# Patient Record
Sex: Female | Born: 1970 | Race: White | Hispanic: No | Marital: Married | Smoking: Never smoker
Health system: Southern US, Community
[De-identification: ages and names within clinical notes are randomized; demographics above are authoritative.]

## PROBLEM LIST (undated history)

## (undated) DIAGNOSIS — R51 Headache: Secondary | ICD-10-CM

## (undated) DIAGNOSIS — R519 Headache, unspecified: Secondary | ICD-10-CM

## (undated) DIAGNOSIS — R609 Edema, unspecified: Secondary | ICD-10-CM

## (undated) DIAGNOSIS — R112 Nausea with vomiting, unspecified: Secondary | ICD-10-CM

## (undated) DIAGNOSIS — Z9889 Other specified postprocedural states: Secondary | ICD-10-CM

## (undated) DIAGNOSIS — K589 Irritable bowel syndrome without diarrhea: Secondary | ICD-10-CM

## (undated) DIAGNOSIS — G8929 Other chronic pain: Secondary | ICD-10-CM

## (undated) DIAGNOSIS — K219 Gastro-esophageal reflux disease without esophagitis: Secondary | ICD-10-CM

## (undated) HISTORY — DX: Headache, unspecified: R51.9

## (undated) HISTORY — DX: Edema, unspecified: R60.9

## (undated) HISTORY — PX: NO PAST SURGERIES: SHX2092

## (undated) HISTORY — DX: Other chronic pain: G89.29

## (undated) HISTORY — DX: Headache: R51

## (undated) HISTORY — DX: Irritable bowel syndrome, unspecified: K58.9

## (undated) HISTORY — DX: Gastro-esophageal reflux disease without esophagitis: K21.9

---

## 2017-01-06 ENCOUNTER — Encounter: Payer: Self-pay | Admitting: Family Medicine

## 2017-01-06 ENCOUNTER — Other Ambulatory Visit: Payer: Self-pay | Admitting: Family Medicine

## 2017-01-06 ENCOUNTER — Ambulatory Visit (INDEPENDENT_AMBULATORY_CARE_PROVIDER_SITE_OTHER): Payer: 59 | Admitting: Family Medicine

## 2017-01-06 VITALS — BP 120/86 | HR 81 | Temp 98.4°F | Ht 66.0 in | Wt 245.2 lb

## 2017-01-06 DIAGNOSIS — K219 Gastro-esophageal reflux disease without esophagitis: Secondary | ICD-10-CM | POA: Diagnosis not present

## 2017-01-06 DIAGNOSIS — E6609 Other obesity due to excess calories: Secondary | ICD-10-CM

## 2017-01-06 DIAGNOSIS — Z5181 Encounter for therapeutic drug level monitoring: Secondary | ICD-10-CM

## 2017-01-06 DIAGNOSIS — E559 Vitamin D deficiency, unspecified: Secondary | ICD-10-CM | POA: Diagnosis not present

## 2017-01-06 DIAGNOSIS — K589 Irritable bowel syndrome without diarrhea: Secondary | ICD-10-CM | POA: Insufficient documentation

## 2017-01-06 DIAGNOSIS — R600 Localized edema: Secondary | ICD-10-CM

## 2017-01-06 DIAGNOSIS — Z6839 Body mass index (BMI) 39.0-39.9, adult: Secondary | ICD-10-CM

## 2017-01-06 DIAGNOSIS — E669 Obesity, unspecified: Secondary | ICD-10-CM | POA: Insufficient documentation

## 2017-01-06 LAB — COMPREHENSIVE METABOLIC PANEL
ALT: 25 U/L (ref 0–35)
AST: 20 U/L (ref 0–37)
Albumin: 3.9 g/dL (ref 3.5–5.2)
Alkaline Phosphatase: 64 U/L (ref 39–117)
BUN: 17 mg/dL (ref 6–23)
CO2: 29 mEq/L (ref 19–32)
Calcium: 9.4 mg/dL (ref 8.4–10.5)
Chloride: 106 mEq/L (ref 96–112)
Creatinine, Ser: 0.9 mg/dL (ref 0.40–1.20)
GFR: 72.37 mL/min (ref >60.00)
Glucose, Bld: 96 mg/dL (ref 70–99)
Potassium: 4.2 mEq/L (ref 3.5–5.1)
Sodium: 140 mEq/L (ref 135–145)
Total Bilirubin: 0.9 mg/dL (ref 0.2–1.2)
Total Protein: 7.2 g/dL (ref 6.0–8.3)

## 2017-01-06 LAB — VITAMIN D 25 HYDROXY (VIT D DEFICIENCY, FRACTURES): VITD: 22.15 ng/mL — ABNORMAL LOW (ref 30.00–100.00)

## 2017-01-06 MED ORDER — TRIAMTERENE-HCTZ 37.5-25 MG PO TABS: 1.0000 | ORAL_TABLET | Freq: Every day | ORAL | 1 refills | Status: DC

## 2017-01-06 MED ORDER — RANITIDINE HCL 150 MG PO TABS: 150.0000 mg | ORAL_TABLET | Freq: Two times a day (BID) | ORAL | 1 refills | Status: DC

## 2017-01-06 NOTE — Assessment & Plan Note (Signed)
Recheck vitamin D today 

## 2017-01-06 NOTE — Assessment & Plan Note (Signed)
Started on Zantac. Refer to GI.

## 2017-01-06 NOTE — Assessment & Plan Note (Signed)
Constipation predominant. Continue MiraLAX. Given FODMAP diet info.

## 2017-01-06 NOTE — Patient Instructions (Signed)
Nice to meet you. We will start you on Zantac for reflux. We will also refer you to GI. I refilled your Maxzide. We'll check some lab work today as well. Please look at the diet information for IBS. Please work on diet and exercise as well.

## 2017-01-06 NOTE — Progress Notes (Signed)
Pre visit review using our clinic review tool, if applicable. No additional management support is needed unless otherwise documented below in the visit note. 

## 2017-01-06 NOTE — Progress Notes (Signed)
Casey Rumps, MD Phone: [phone]  Casey Colon is a 46 y.o. female who presents today for new patient visit.  GERD: Patient notes occasional burning sensation. Has been on Prilosec. This was beneficial. She's come off of this and occasionally gets burning sensation. No abdominal pain or blood in her stool. Burps a lot.  She is on Maxzide for edema in her ankles and calves. Has been on this chronically. Notes this does help with this. No prior kidney dysfunction. No blood pressure issues.  Vitamin D deficiency: Was on vitamin D supplementation and discontinued this several months ago. Never had a recheck.  Has constipation predominant IBS and does take MiraLAX with good benefit for this.  Patient notes she was on phentermine for weight loss though has not taken in several months. She doesn't really do much to help with her diet. Likes her sweets. Not exercising at all.  Active Ambulatory Problems    Diagnosis Date Noted  . GERD (gastroesophageal reflux disease) 01/06/2017  . Vitamin D deficiency 01/06/2017  . Obesity 01/06/2017  . IBS (irritable bowel syndrome) 01/06/2017  . Bilateral lower extremity edema 01/06/2017   Resolved Ambulatory Problems    Diagnosis Date Noted  . No Resolved Ambulatory Problems   Past Medical History:  Diagnosis Date  . Chronic headaches   . Edema   . GERD (gastroesophageal reflux disease)   . IBS (irritable bowel syndrome)     Family History  Problem Relation Age of Onset  . Breast cancer Maternal Grandmother     Social History   Social History  . Marital status: Married    Spouse name: N/A  . Number of children: N/A  . Years of education: N/A   Occupational History  . Not on file.   Social History Main Topics  . Smoking status: Never Smoker  . Smokeless tobacco: Never Used  . Alcohol use No  . Drug use: No  . Sexual activity: Not on file   Other Topics Concern  . Not on file   Social History Narrative  . No narrative  on file    ROS  General:  Negative for nexplained weight loss, fever Skin: Negative for new or changing mole, sore that won't heal HEENT: Negative for trouble hearing, trouble seeing, ringing in ears, mouth sores, hoarseness, change in voice, dysphagia. CV:  Negative for chest pain, dyspnea, edema, palpitations Resp: Negative for cough, dyspnea, hemoptysis GI: Negative for nausea, vomiting, diarrhea, constipation, abdominal pain, melena, hematochezia. GU: Negative for dysuria, incontinence, urinary hesitance, hematuria, vaginal or penile discharge, polyuria, sexual difficulty, lumps in testicle or breasts MSK: Negative for muscle cramps or aches, joint pain or swelling Neuro: Negative for headaches, weakness, numbness, dizziness, passing out/fainting Psych: Negative for depression, anxiety, memory problems  Objective  Physical Exam Vitals:   01/06/17 1330  BP: 120/86  Pulse: 81  Temp: 98.4 F (36.9 C)    BP Readings from Last 3 Encounters:  01/06/17 120/86   Wt Readings from Last 3 Encounters:  01/06/17 245 lb 3.2 oz (111.2 kg)    Physical Exam  Constitutional: No distress.  HENT:  Head: Normocephalic and atraumatic.  Eyes: Conjunctivae are normal. Pupils are equal, round, and reactive to light.  Cardiovascular: Normal rate, regular rhythm and normal heart sounds.   Pulmonary/Chest: Effort normal and breath sounds normal.  Abdominal: Soft. Bowel sounds are normal. She exhibits no distension. There is no tenderness. There is no rebound and no guarding.  Musculoskeletal: She exhibits no edema.  Neurological:  She is alert. Gait normal.  Skin: Skin is warm and dry. She is not diaphoretic.  Psychiatric: Mood and affect normal.     Assessment/Plan:   GERD (gastroesophageal reflux disease) Started on Zantac. Refer to GI.  Vitamin D deficiency Recheck vitamin D today.  Obesity Discussed that I do not prescribe phentermine. Could consider other weight loss medication  in the future though I would require her to try diet and exercise for 1-2 months in the very least. Discussed exercising and diet changes.  IBS (irritable bowel syndrome) Constipation predominant. Continue MiraLAX. Given FODMAP diet info.  Bilateral lower extremity edema Chronic intermittent issue. Is on Maxzide for this. No edema at this time. We'll request prior records. CMP today. Refill Maxzide.   Orders Placed This Encounter  Procedures  . Comp Met (CMET)  . Vitamin D (25 hydroxy)  . Ambulatory referral to Gastroenterology    Referral Priority:   Routine    Referral Type:   Consultation    Referral Reason:   Specialty Services Required    Number of Visits Requested:   1    Meds ordered this encounter  Medications  . DISCONTD: triamterene-hydrochlorothiazide (MAXZIDE-25) 37.5-25 MG tablet    Sig: Take 1 tablet by mouth daily.  Marland Kitchen DISCONTD: Vitamin D, Ergocalciferol, (DRISDOL) 50000 units CAPS capsule    Sig: Take 50,000 Units by mouth every 7 (seven) days.  . calcium carbonate (TUMS - DOSED IN MG ELEMENTAL CALCIUM) 500 MG chewable tablet    Sig: Chew 1 tablet by mouth daily.  Marland Kitchen DISCONTD: phentermine 37.5 MG capsule    Sig: Take 37.5 mg by mouth every morning.  . triamterene-hydrochlorothiazide (MAXZIDE-25) 37.5-25 MG tablet    Sig: Take 1 tablet by mouth daily.    Dispense:  90 tablet    Refill:  1  . ranitidine (ZANTAC) 150 MG tablet    Sig: Take 1 tablet (150 mg total) by mouth 2 (two) times daily.    Dispense:  60 tablet    Refill:  [place], MD [place]

## 2017-01-06 NOTE — Assessment & Plan Note (Signed)
Discussed that I do not prescribe phentermine. Could consider other weight loss medication in the future though I would require her to try diet and exercise for 1-2 months in the very least. Discussed exercising and diet changes.

## 2017-01-06 NOTE — Assessment & Plan Note (Signed)
Chronic intermittent issue. Is on Maxzide for this. No edema at this time. We'll request prior records. CMP today. Refill Maxzide.

## 2017-01-11 ENCOUNTER — Other Ambulatory Visit: Payer: Self-pay | Admitting: Family Medicine

## 2017-01-11 DIAGNOSIS — E559 Vitamin D deficiency, unspecified: Secondary | ICD-10-CM

## 2017-01-11 MED ORDER — VITAMIN D (ERGOCALCIFEROL) 1.25 MG (50000 UNIT) PO CAPS: 50000.0000 [IU] | ORAL_CAPSULE | ORAL | 0 refills | Status: DC

## 2017-01-13 ENCOUNTER — Encounter: Payer: Self-pay | Admitting: Gastroenterology

## 2017-02-10 ENCOUNTER — Ambulatory Visit: Payer: 59 | Admitting: Gastroenterology

## 2017-02-10 ENCOUNTER — Ambulatory Visit: Payer: Self-pay | Admitting: Gastroenterology

## 2017-02-26 ENCOUNTER — Ambulatory Visit (INDEPENDENT_AMBULATORY_CARE_PROVIDER_SITE_OTHER): Payer: 59 | Admitting: Family Medicine

## 2017-02-26 ENCOUNTER — Encounter: Payer: Self-pay | Admitting: Family Medicine

## 2017-02-26 ENCOUNTER — Ambulatory Visit: Payer: 59 | Admitting: Family Medicine

## 2017-02-26 VITALS — BP 112/88 | HR 86 | Temp 98.8°F | Ht 66.0 in | Wt 245.6 lb

## 2017-02-26 DIAGNOSIS — Z1231 Encounter for screening mammogram for malignant neoplasm of breast: Secondary | ICD-10-CM | POA: Diagnosis not present

## 2017-02-26 DIAGNOSIS — Z0001 Encounter for general adult medical examination with abnormal findings: Secondary | ICD-10-CM | POA: Diagnosis not present

## 2017-02-26 DIAGNOSIS — E669 Obesity, unspecified: Secondary | ICD-10-CM | POA: Diagnosis not present

## 2017-02-26 DIAGNOSIS — Z6839 Body mass index (BMI) 39.0-39.9, adult: Secondary | ICD-10-CM

## 2017-02-26 DIAGNOSIS — E6609 Other obesity due to excess calories: Secondary | ICD-10-CM

## 2017-02-26 DIAGNOSIS — Z23 Encounter for immunization: Secondary | ICD-10-CM

## 2017-02-26 DIAGNOSIS — Z1239 Encounter for other screening for malignant neoplasm of breast: Secondary | ICD-10-CM

## 2017-02-26 DIAGNOSIS — IMO0001 Reserved for inherently not codable concepts without codable children: Secondary | ICD-10-CM

## 2017-02-26 MED ORDER — TRIAMTERENE-HCTZ 37.5-25 MG PO TABS: 1.0000 | ORAL_TABLET | Freq: Every day | ORAL | 1 refills | Status: DC

## 2017-02-26 NOTE — Patient Instructions (Signed)
Nice to see you. Please start working on diet and exercise as we discussed. You can try the [place]. We'll get set up for mammogram. If the area of burst blood vessels does not improve let us know.

## 2017-02-26 NOTE — Assessment & Plan Note (Addendum)
Physical exam completed. Pelvic exam deferred given normal Pap smear last year. Encouraged diet and exercise. Tetanus vaccination brought up-to-date. Advised on Allegra for likely allergies or viral illness. She'll monitor this. Mammogram ordered. She will return for fasting lab work.

## 2017-02-26 NOTE — Progress Notes (Signed)
Pre visit review using our clinic review tool, if applicable. No additional management support is needed unless otherwise documented below in the visit note. 

## 2017-02-26 NOTE — Addendum Note (Signed)
Addended by: Leone Haven on: 02/26/2017 03:02 PM   Modules accepted: Orders

## 2017-02-26 NOTE — Progress Notes (Signed)
Casey Rumps, MD Phone: [phone]  Casey Colon is a 46 y.o. female who presents today for physical exam.  Does exercise somewhat though not that frequently. Diet is poor. She eats out at least 3-4 times a week. Does cook some. Had a Pap smear last year. Mammogram last year as well. She reports having menstrual cycles typically once monthly lasting for 3-4 days. Occasionally she will skip a month. She had a workup through her prior gynecologist did not reveal any specific cause. She is sexually active with women. Family history of breast cancer in her maternal grandmother. For HIV testing 2012. Not up-to-date on tetanus vaccination. She is up-to-date on flu vaccination. No tobacco use. No illicit drug use. Rare alcohol use.  Patient has had a little bit of cough and sore throat recently. Some postnasal drip. Notes it has improved to some degree from. Previously. 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 She's been taking Mucinex.  Active Ambulatory Problems    Diagnosis Date Noted  . GERD (gastroesophageal reflux disease) 01/06/2017  . Vitamin D deficiency 01/06/2017  . Obesity 01/06/2017  . IBS (irritable bowel syndrome) 01/06/2017  . Bilateral lower extremity edema 01/06/2017  . Encounter for general adult medical examination with abnormal findings 02/26/2017   Resolved Ambulatory Problems    Diagnosis Date Noted  . No Resolved Ambulatory Problems   Past Medical History:  Diagnosis Date  . Chronic headaches   . Edema   . GERD (gastroesophageal reflux disease)   . IBS (irritable bowel syndrome)     Family History  Problem Relation Age of Onset  . Breast cancer Maternal Grandmother     Social History   Social History  . Marital status: Married    Spouse name: N/A  . Number of children: N/A  . Years of education: N/A   Occupational History  . Not on file.   Social History Main Topics  . Smoking status: Never Smoker  .  Smokeless tobacco: Never Used  . Alcohol use No  . Drug use: No  . Sexual activity: Not on file   Other Topics Concern  . Not on file   Social History Narrative  . No narrative on file    ROS  General:  Negative for nexplained weight loss, fever Skin: Negative for new or changing mole, sore that won't heal HEENT: Negative for trouble hearing, trouble seeing, ringing in ears, mouth sores, hoarseness, change in voice, dysphagia. CV:  Negative for chest pain, dyspnea, edema, palpitations Resp: Positive for cough, negative for dyspnea, hemoptysis GI: Negative for nausea, vomiting, diarrhea, constipation, abdominal pain, melena, hematochezia. GU: Negative for dysuria, incontinence, urinary hesitance, hematuria, vaginal or penile discharge, polyuria, sexual difficulty, lumps in testicle or breasts MSK: Negative for muscle cramps or aches, joint pain or swelling Neuro: Negative for headaches, weakness, numbness, dizziness, passing out/fainting Psych: Negative for depression, anxiety, memory problems  Objective  Physical Exam Vitals:   02/26/17 1420  BP: 112/88  Pulse: 86  Temp: 98.8 F (37.1 C)    BP Readings from Last 3 Encounters:  02/26/17 112/88  01/06/17 120/86   Wt Readings from Last 3 Encounters:  02/26/17 245 lb 9.6 oz (111.4 kg)  01/06/17 245 lb 3.2 oz (111.2 kg)    Physical Exam  Constitutional: No distress.  HENT:  Head: Normocephalic and atraumatic.  Mouth/Throat: Oropharynx is clear and moist. No oropharyngeal exudate.  Eyes: Conjunctivae are normal. Pupils are equal, round, and reactive to light.  Neck: Neck supple.  Cardiovascular: Normal rate, regular rhythm and normal heart sounds.   Pulmonary/Chest: Effort normal and breath sounds normal.  Abdominal: Soft. Bowel sounds are normal. She exhibits no distension. There is no tenderness. There is no rebound and no guarding.  Genitourinary:  Genitourinary Comments: Breasts exam completed, left breast with  small area of burst blood vessels just underneath the skin in the lower inner quadrant that patient reports most of occurred today as they were not there previously, there is no underlying lesion, no masses palpated bilateral breasts, no nipple inversion bilateral breasts, no axillary masses bilaterally  Musculoskeletal: She exhibits no edema.  Lymphadenopathy:    She has no cervical adenopathy.  Neurological: She is alert. Gait normal.  Skin: Skin is warm and dry. She is not diaphoretic.  Psychiatric: Mood and affect normal.     Assessment/Plan:   Encounter for general adult medical examination with abnormal findings Physical exam completed. Pelvic exam deferred given normal Pap smear last year. Encouraged diet and exercise. Tetanus vaccination brought up-to-date. Advised on Allegra for likely allergies or viral illness. She'll monitor this. Mammogram ordered. She will return for fasting lab work.   Orders Placed This Encounter  Procedures  . MM SCREENING BREAST TOMO BILATERAL    Schedule after 04/08/17. Previously done through [facility].    Standing Status:   Future    Standing Expiration Date:   04/28/2018    Order Specific Question:   Reason for Exam (SYMPTOM  OR DIAGNOSIS REQUIRED)    Answer:   breast cancer screening    Order Specific Question:   Is the patient pregnant?    Answer:   No    Order Specific Question:   Preferred imaging location?    Answer:   [facility]  . Tdap vaccine greater than or equal to 7yo IM  . Lipid Profile    Standing Status:   Future    Standing Expiration Date:   02/26/2018  . HgB A1c    Standing Status:   Future    Standing Expiration Date:   02/26/2018    No orders of the defined types were placed in this encounter.    Casey Rumps, MD [place]

## 2017-03-10 ENCOUNTER — Other Ambulatory Visit (INDEPENDENT_AMBULATORY_CARE_PROVIDER_SITE_OTHER): Payer: 59

## 2017-03-10 DIAGNOSIS — E6609 Other obesity due to excess calories: Secondary | ICD-10-CM | POA: Diagnosis not present

## 2017-03-10 DIAGNOSIS — E559 Vitamin D deficiency, unspecified: Secondary | ICD-10-CM | POA: Diagnosis not present

## 2017-03-10 DIAGNOSIS — Z6839 Body mass index (BMI) 39.0-39.9, adult: Secondary | ICD-10-CM

## 2017-03-10 DIAGNOSIS — IMO0001 Reserved for inherently not codable concepts without codable children: Secondary | ICD-10-CM

## 2017-03-10 LAB — LIPID PANEL
Cholesterol: 188 mg/dL (ref 0–200)
HDL: 43 mg/dL (ref >39.00)
LDL Cholesterol: 120 mg/dL — ABNORMAL HIGH (ref 0–99)
NonHDL: 144.58
Total CHOL/HDL Ratio: 4
Triglycerides: 121 mg/dL (ref 0.0–149.0)
VLDL: 24.2 mg/dL (ref 0.0–40.0)

## 2017-03-10 LAB — HEMOGLOBIN A1C: Hgb A1c MFr Bld: 5.6 % (ref 4.6–6.5)

## 2017-03-10 LAB — VITAMIN D 25 HYDROXY (VIT D DEFICIENCY, FRACTURES): VITD: 28.62 ng/mL — ABNORMAL LOW (ref 30.00–100.00)

## 2017-03-15 ENCOUNTER — Telehealth: Payer: Self-pay | Admitting: *Deleted

## 2017-03-15 NOTE — Telephone Encounter (Signed)
Pt stated that the pharmacy did not receive [name]  Pt contact [phone]

## 2017-03-15 NOTE — Telephone Encounter (Signed)
Left message to return call, pharmacy states they did receive rx on 02/26/17 90 1rf

## 2017-04-05 ENCOUNTER — Telehealth: Payer: Self-pay | Admitting: Radiology

## 2017-04-05 NOTE — Telephone Encounter (Signed)
Can you call pt to confirm? See prev note from PCP.

## 2017-04-05 NOTE — Telephone Encounter (Signed)
Patient does not need lab work tomorrow. Please let her know this.

## 2017-04-05 NOTE — Telephone Encounter (Signed)
Pt coming in for labs tomorrow, please place future orders. Thank you.  

## 2017-04-05 NOTE — Telephone Encounter (Signed)
I am unsure why the patient is coming in for labs. Can you check with her to see if we contacted her since her labs were drawn earlier this month to see if we told her to come in for more lab work. Thanks.

## 2017-04-05 NOTE — Telephone Encounter (Signed)
Patient notified

## 2017-04-05 NOTE — Telephone Encounter (Signed)
I made lab appointment per Dr.Sonnenberg request on 01/12/17 for 05/01 looks like front desk made new lab appointment for patient on 02/26/17 for 03/10/17 and did not cancel the lab appointment for 04/06/17

## 2017-04-06 ENCOUNTER — Other Ambulatory Visit: Payer: 59

## 2017-04-21 LAB — HM MAMMOGRAPHY

## 2017-04-29 ENCOUNTER — Encounter: Payer: Self-pay | Admitting: Family Medicine

## 2017-07-12 ENCOUNTER — Telehealth: Payer: Self-pay | Admitting: Family Medicine

## 2017-07-12 MED ORDER — TRIAMTERENE-HCTZ 37.5-25 MG PO TABS: 1.0000 | ORAL_TABLET | Freq: Every day | ORAL | 1 refills | Status: DC

## 2017-07-12 NOTE — Telephone Encounter (Signed)
Refill on triamterene-hydrochlorothiazide (MAXZIDE-25) 37.5-25 MG

## 2017-07-12 NOTE — Telephone Encounter (Signed)
Sent pharmacy  

## 2017-08-31 ENCOUNTER — Ambulatory Visit: Payer: 59 | Admitting: Family Medicine

## 2017-12-08 ENCOUNTER — Ambulatory Visit: Payer: Self-pay | Admitting: Family Medicine

## 2017-12-08 DIAGNOSIS — Z0289 Encounter for other administrative examinations: Secondary | ICD-10-CM

## 2018-01-15 ENCOUNTER — Other Ambulatory Visit: Payer: Self-pay | Admitting: Family Medicine

## 2018-01-18 ENCOUNTER — Other Ambulatory Visit: Payer: Self-pay

## 2018-01-18 MED ORDER — TRIAMTERENE-HCTZ 37.5-25 MG PO TABS: 1.0000 | ORAL_TABLET | Freq: Every day | ORAL | 1 refills | Status: DC

## 2018-03-01 ENCOUNTER — Other Ambulatory Visit (HOSPITAL_COMMUNITY)
Admission: RE | Admit: 2018-03-01 | Discharge: 2018-03-01 | Disposition: A | Discharge status: Home / Self Care | Payer: BLUE CROSS/BLUE SHIELD | Source: Ambulatory Visit | Attending: Family Medicine | Admitting: Family Medicine

## 2018-03-01 ENCOUNTER — Encounter: Payer: Self-pay | Admitting: Family Medicine

## 2018-03-01 ENCOUNTER — Other Ambulatory Visit: Payer: Self-pay

## 2018-03-01 ENCOUNTER — Ambulatory Visit (INDEPENDENT_AMBULATORY_CARE_PROVIDER_SITE_OTHER): Payer: BLUE CROSS/BLUE SHIELD | Admitting: Family Medicine

## 2018-03-01 VITALS — BP 110/86 | HR 80 | Temp 98.3°F | Ht 66.0 in | Wt 251.0 lb

## 2018-03-01 DIAGNOSIS — Z124 Encounter for screening for malignant neoplasm of cervix: Secondary | ICD-10-CM | POA: Diagnosis present

## 2018-03-01 DIAGNOSIS — Z1231 Encounter for screening mammogram for malignant neoplasm of breast: Secondary | ICD-10-CM | POA: Diagnosis not present

## 2018-03-01 DIAGNOSIS — E559 Vitamin D deficiency, unspecified: Secondary | ICD-10-CM | POA: Diagnosis not present

## 2018-03-01 DIAGNOSIS — Z0001 Encounter for general adult medical examination with abnormal findings: Secondary | ICD-10-CM | POA: Diagnosis not present

## 2018-03-01 DIAGNOSIS — Z13220 Encounter for screening for lipoid disorders: Secondary | ICD-10-CM

## 2018-03-01 DIAGNOSIS — G8929 Other chronic pain: Secondary | ICD-10-CM

## 2018-03-01 DIAGNOSIS — N926 Irregular menstruation, unspecified: Secondary | ICD-10-CM

## 2018-03-01 DIAGNOSIS — Z1239 Encounter for other screening for malignant neoplasm of breast: Secondary | ICD-10-CM

## 2018-03-01 DIAGNOSIS — M25561 Pain in right knee: Secondary | ICD-10-CM

## 2018-03-01 LAB — LIPID PANEL
Cholesterol: 202 mg/dL — ABNORMAL HIGH (ref 0–200)
HDL: 44.8 mg/dL (ref >39.00)
LDL Cholesterol: 127 mg/dL — ABNORMAL HIGH (ref 0–99)
NonHDL: 156.88
Total CHOL/HDL Ratio: 5
Triglycerides: 150 mg/dL — ABNORMAL HIGH (ref 0.0–149.0)
VLDL: 30 mg/dL (ref 0.0–40.0)

## 2018-03-01 LAB — COMPREHENSIVE METABOLIC PANEL
ALT: 14 U/L (ref 0–35)
AST: 15 U/L (ref 0–37)
Albumin: 4 g/dL (ref 3.5–5.2)
Alkaline Phosphatase: 69 U/L (ref 39–117)
BUN: 22 mg/dL (ref 6–23)
CO2: 27 mEq/L (ref 19–32)
Calcium: 9.3 mg/dL (ref 8.4–10.5)
Chloride: 99 mEq/L (ref 96–112)
Creatinine, Ser: 0.98 mg/dL (ref 0.40–1.20)
GFR: 65.25 mL/min (ref >60.00)
Glucose, Bld: 107 mg/dL — ABNORMAL HIGH (ref 70–99)
Potassium: 3.4 mEq/L — ABNORMAL LOW (ref 3.5–5.1)
Sodium: 137 mEq/L (ref 135–145)
Total Bilirubin: 0.9 mg/dL (ref 0.2–1.2)
Total Protein: 7.7 g/dL (ref 6.0–8.3)

## 2018-03-01 LAB — VITAMIN D 25 HYDROXY (VIT D DEFICIENCY, FRACTURES): VITD: 24.1 ng/mL — ABNORMAL LOW (ref 30.00–100.00)

## 2018-03-01 LAB — HEMOGLOBIN A1C: Hgb A1c MFr Bld: 5.6 % (ref 4.6–6.5)

## 2018-03-01 LAB — TSH: TSH: 2.68 u[IU]/mL (ref 0.35–4.50)

## 2018-03-01 NOTE — Assessment & Plan Note (Signed)
Benign exam today.  Will refer to orthopedics for further evaluation.

## 2018-03-01 NOTE — Assessment & Plan Note (Addendum)
Physical exam completed.  Encourage diet and exercise.  She will return for fasting lab work.  Pap smear completed.  Refer to gynecology given possible uterine prolapse as well as irregular menstrual cycle.  Patient will contact the breast center for her mammogram.

## 2018-03-01 NOTE — Assessment & Plan Note (Signed)
Menses have been irregular for some time now.   she denies pregnancy and is not sexually active with a female.  Potentially could be thyroid related.  She notes no vision changes or lactation making hyperprolactinemia not likely.  We will check a TSH.  We will refer to gynecology for further evaluation as well.

## 2018-03-01 NOTE — Progress Notes (Signed)
Eric Sonnenberg, MD Phone: [phone]  Casey Colon is a 47 y.o. female who presents today for CPE.  Diets not being good. Has not been able to exercise much related to possible vaginal prolapse. Pap smear up-to-date 04/10/15. Tetanus vaccination up-to-date.  Flu vaccination up-to-date. She reports HIV testing several years ago.  She is monogamous with her wife. She reports a menstrual cycle that has been irregular for some time now.  She will have cycles where she spots a little bit for a day or 2 and then she will have other cycles that last up to 7 days.  She feels as though she has had some vaginal or uterine prolapse.  She does strain a lot from constipation though that has improved with MiraLAX.  She notes some discomfort with her most recent menstrual cycle.  She is on day 6 or 7 of her cycle now and it is tapering off.  No intermenstrual bleeding.  She has not seen a gynecologist.  No family history of ovarian cancer.  Does have a family history of breast cancer in her maternal grandmother.  No family history of colon cancer. No tobacco use or illicit drug use.  Rare alcohol use.  She has had chronic intermittent issues with her right knee.  No specific injury recently.  Bothers her more when she goes to squat or if she has been on her legs for long periods of time.  She had fluid pulled off several years ago and had a steroid injection.  Notes intermittent swelling in the knee.  Hurts along the lateral joint line.  She ices it.  No popping, locking, or catching.  Active Ambulatory Problems    Diagnosis Date Noted  . GERD (gastroesophageal reflux disease) 01/06/2017  . Vitamin D deficiency 01/06/2017  . Obesity 01/06/2017  . IBS (irritable bowel syndrome) 01/06/2017  . Bilateral lower extremity edema 01/06/2017  . Encounter for general adult medical examination with abnormal findings 02/26/2017  . Irregular menses 03/01/2018  . Chronic pain of right knee 03/01/2018   Resolved  Ambulatory Problems    Diagnosis Date Noted  . No Resolved Ambulatory Problems   Past Medical History:  Diagnosis Date  . Chronic headaches   . Edema   . GERD (gastroesophageal reflux disease)   . IBS (irritable bowel syndrome)     Family History  Problem Relation Age of Onset  . Breast cancer Maternal Grandmother     Social History   Socioeconomic History  . Marital status: Married    Spouse name: Not on file  . Number of children: Not on file  . Years of education: Not on file  . Highest education level: Not on file  Occupational History  . Not on file  Social Needs  . Financial resource strain: Not on file  . Food insecurity:    Worry: Not on file    Inability: Not on file  . Transportation needs:    Medical: Not on file    Non-medical: Not on file  Tobacco Use  . Smoking status: Never Smoker  . Smokeless tobacco: Never Used  Substance and Sexual Activity  . Alcohol use: No  . Drug use: No  . Sexual activity: Not on file  Lifestyle  . Physical activity:    Days per week: Not on file    Minutes per session: Not on file  . Stress: Not on file  Relationships  . Social connections:    Talks on phone: Not on file      Gets together: Not on file    Attends religious service: Not on file    Active member of club or organization: Not on file    Attends meetings of clubs or organizations: Not on file    Relationship status: Not on file  . Intimate partner violence:    Fear of current or ex partner: Not on file    Emotionally abused: Not on file    Physically abused: Not on file    Forced sexual activity: Not on file  Other Topics Concern  . Not on file  Social History Narrative  . Not on file    ROS  General:  Negative for nexplained weight loss, fever Skin: Negative for new or changing mole, sore that won't heal HEENT: Negative for trouble hearing, trouble seeing, ringing in ears, mouth sores, hoarseness, change in voice, dysphagia. CV:  Negative for  chest pain, dyspnea, edema, palpitations Resp: Negative for cough, dyspnea, hemoptysis GI: Negative for nausea, vomiting, diarrhea, constipation, abdominal pain, melena, hematochezia. GU: Negative for dysuria, incontinence, urinary hesitance, hematuria, vaginal or penile discharge, polyuria, sexual difficulty, lumps in testicle or breasts MSK: Negative for muscle cramps or aches, positive joint pain or swelling Neuro: Negative for headaches, weakness, numbness, dizziness, passing out/fainting Psych: Negative for depression, anxiety, memory problems  Objective  Physical Exam Vitals:   03/01/18 1309  BP: 110/86  Pulse: 80  Temp: 98.3 F (36.8 C)  SpO2: 97%    BP Readings from Last 3 Encounters:  03/01/18 110/86  02/26/17 112/88  01/06/17 120/86   Wt Readings from Last 3 Encounters:  03/01/18 251 lb (113.9 kg)  02/26/17 245 lb 9.6 oz (111.4 kg)  01/06/17 245 lb 3.2 oz (111.2 kg)   Chaperone used. Physical Exam  Constitutional: No distress.  HENT:  Head: Normocephalic and atraumatic.  Mouth/Throat: Oropharynx is clear and moist.  Eyes: Pupils are equal, round, and reactive to light. Conjunctivae are normal.  Cardiovascular: Normal rate, regular rhythm and normal heart sounds.  Pulmonary/Chest: Effort normal and breath sounds normal.  Abdominal: Soft. Bowel sounds are normal. She exhibits no distension. There is no tenderness. There is no rebound and no guarding.  Genitourinary:  Genitourinary Comments: Normal labia, normal vaginal mucosa, normal-appearing cervix though it does appear slightly lower in the vagina, no adnexal tenderness or masses, bilateral breast with no tenderness, warmth, erythema, or skin changes, no axillary masses bilaterally  Musculoskeletal: She exhibits no edema.  Bilateral knees with no tenderness, warmth, swelling, or erythema, no ligamentous laxity, negative McMurray's  Neurological: She is alert. Gait normal.  Skin: Skin is warm and dry. She is not  diaphoretic.  Psychiatric: Mood and affect normal.     Assessment/Plan:   Encounter for general adult medical examination with abnormal findings Physical exam completed.  Encourage diet and exercise.  She will return for fasting lab work.  Pap smear completed.  Refer to gynecology given possible uterine prolapse as well as irregular menstrual cycle.  Patient will contact the breast center for her mammogram.  Irregular menses Menses have been irregular for some time now.   she denies pregnancy and is not sexually active with a female.  Potentially could be thyroid related.  She notes no vision changes or lactation making hyperprolactinemia not likely.  We will check a TSH.  We will refer to gynecology for further evaluation as well.  Chronic pain of right knee Benign exam today.  Will refer to orthopedics for further evaluation.   Orders Placed This Encounter  Procedures  . MM SCREENING BREAST TOMO BILATERAL    Standing Status:   Future    Standing Expiration Date:   05/02/2019    Order Specific Question:   Reason for Exam (SYMPTOM  OR DIAGNOSIS REQUIRED)    Answer:   breast cancer screening    Order Specific Question:   Is the patient pregnant?    Answer:   No    Order Specific Question:   Preferred imaging location?    Answer:   [facility]  . Comp Met (CMET)  . TSH  . Lipid panel  . HgB A1c  . Vitamin D (25 hydroxy)  . Ambulatory referral to Gynecology    Referral Priority:   Routine    Referral Type:   Consultation    Referral Reason:   Specialty Services Required    Requested Specialty:   Gynecology    Number of Visits Requested:   1  . Ambulatory referral to Orthopedic Surgery    Referral Priority:   Routine    Referral Type:   Surgical    Referral Reason:   Specialty Services Required    Requested Specialty:   Orthopedic Surgery    Number of Visits Requested:   1    No orders of the defined types were placed in this encounter.    Eric Sonnenberg,  MD [facility]  

## 2018-03-01 NOTE — Patient Instructions (Signed)
Nice to see you. We will get you set up with gynecology.  We will refer you to orthopedics as well. We will contact you with your lab results.

## 2018-03-03 LAB — CYTOLOGY - PAP
Diagnosis: NEGATIVE
HPV: NOT DETECTED

## 2018-03-05 ENCOUNTER — Other Ambulatory Visit: Payer: Self-pay | Admitting: Family Medicine

## 2018-03-05 DIAGNOSIS — E559 Vitamin D deficiency, unspecified: Secondary | ICD-10-CM

## 2018-04-06 ENCOUNTER — Encounter: Payer: Self-pay | Admitting: Obstetrics and Gynecology

## 2018-04-06 ENCOUNTER — Ambulatory Visit (INDEPENDENT_AMBULATORY_CARE_PROVIDER_SITE_OTHER): Payer: BLUE CROSS/BLUE SHIELD | Admitting: Obstetrics and Gynecology

## 2018-04-06 VITALS — BP 114/78 | HR 78 | Ht 66.0 in | Wt 252.0 lb

## 2018-04-06 DIAGNOSIS — E66812 Obesity, class 2: Secondary | ICD-10-CM

## 2018-04-06 DIAGNOSIS — N951 Menopausal and female climacteric states: Secondary | ICD-10-CM | POA: Diagnosis not present

## 2018-04-06 DIAGNOSIS — N816 Rectocele: Secondary | ICD-10-CM

## 2018-04-06 DIAGNOSIS — N812 Incomplete uterovaginal prolapse: Secondary | ICD-10-CM

## 2018-04-06 DIAGNOSIS — K581 Irritable bowel syndrome with constipation: Secondary | ICD-10-CM

## 2018-04-06 NOTE — Progress Notes (Addendum)
GYN ENCOUNTER NOTE  Subjective:       Casey Colon is a 47 y.o. G57P0010 female is here for gynecologic evaluation of the following issues:  1.  Pelvic pressure 2.  Incomplete bladder emptying 3.  Chronic constipation  47 year old white female para 0-0-1-0, perimenopausal, having irregular menstrual cycles lasting anywhere from 3 to 7 days, occasionally associated with clots, using nothing for contraception at this time, currently in a same-sex relationship, presents for evaluation of gynecologic symptoms including pelvic pressure, incomplete bladder emptying, and chronic constipation.  Patient feels a sense of pelvic pressure, worse with sitting, lessened with standing or lying supine.  She does note possible incomplete bladder emptying, especially at work when she squats to void.  When she sits and voids regularly at home, she appears to completely empty her bladder.  She has urinary frequency approximately 6-8 times a day.  She does not have significant nocturia. [place] has had a long history of irritable bowel syndrome with associated constipation; she is currently using MiraLAX and has bowel movement frequency  approximately daily.  Bouts of constipation are intermittently associated with severe straining and mild splinting.  She has not experienced any blood per rectum.  Recent TSH testing is normal.  Hemoglobin A1c is normal.  Cholesterol panel is notable for slightly elevated triglycerides and borderline total cholesterol and LDL cholesterol.  Menstrual history: January 2000 19-1-day February 2019 none March 2019-7 days with clots April 2019-7 days heavy without clots   Gynecologic History Patient's last menstrual period was 03/17/2018 (exact date). Contraception: abstinence  Pap smear--03/01/2018  Obstetric History OB History  Gravida Para Term Preterm AB Living  1       1    SAB TAB Ectopic Multiple Live Births    1          # Outcome Date GA Lbr Len/2nd Weight Sex Delivery  Anes PTL Lv  1 TAB 1993            Past Medical History:  Diagnosis Date  . Chronic headaches   . Edema   . GERD (gastroesophageal reflux disease)   . IBS (irritable bowel syndrome)     Past Surgical History:  Procedure Laterality Date  . NO PAST SURGERIES      Current Outpatient Medications on File Prior to Visit  Medication Sig Dispense Refill  . calcium carbonate (TUMS - DOSED IN MG ELEMENTAL CALCIUM) 500 MG chewable tablet Chew 1 tablet by mouth daily.    . cholecalciferol (VITAMIN D) 1000 units tablet Take 1,000 Units by mouth daily.    . ranitidine (ZANTAC) 150 MG tablet TAKE 1 TABLET(150 MG) BY MOUTH TWICE DAILY 180 tablet 1  . triamterene-hydrochlorothiazide (MAXZIDE-25) 37.5-25 MG tablet Take 1 tablet by mouth daily. 90 tablet 1   No current facility-administered medications on file prior to visit.     Allergies  Allergen Reactions  . Dilaudid [Hydromorphone Hcl]   . Zoloft [Sertraline] Other (See Comments)    anger    Social History   Socioeconomic History  . Marital status: Married    Spouse name: Not on file  . Number of children: Not on file  . Years of education: Not on file  . Highest education level: Not on file  Occupational History  . Not on file  Social Needs  . Financial resource strain: Not on file  . Food insecurity:    Worry: Not on file    Inability: Not on file  . Transportation needs:  Medical: Not on file    Non-medical: Not on file  Tobacco Use  . Smoking status: Never Smoker  . Smokeless tobacco: Never Used  Substance and Sexual Activity  . Alcohol use: No  . Drug use: No  . Sexual activity: Yes    Comment: same sex  Lifestyle  . Physical activity:    Days per week: Not on file    Minutes per session: Not on file  . Stress: Not on file  Relationships  . Social connections:    Talks on phone: Not on file    Gets together: Not on file    Attends religious service: Not on file    Active member of club or organization:  Not on file    Attends meetings of clubs or organizations: Not on file    Relationship status: Not on file  . Intimate partner violence:    Fear of current or ex partner: Not on file    Emotionally abused: Not on file    Physically abused: Not on file    Forced sexual activity: Not on file  Other Topics Concern  . Not on file  Social History Narrative  . Not on file    Family History  Problem Relation Age of Onset  . Asthma Maternal Grandmother   . Ovarian cancer Neg Hx   . Colon cancer Neg Hx     The following portions of the patient's history were reviewed and updated as appropriate: allergies, current medications, past family history, past medical history, past social history, past surgical history and problem list.  Review of Systems Review of Systems -comprehensive review of systems is negative except for that noted in the HPI. Objective:   BP 114/78   Pulse 78   Ht 5\' 6"  (1.676 m)   Wt 252 lb (114.3 kg)   LMP 03/17/2018 (Exact Date)   BMI 40.67 kg/m  CONSTITUTIONAL: Well-developed, well-nourished female in no acute distress.  HENT:  Normocephalic, atraumatic.  NECK: Normal range of motion, supple, no masses.  Normal thyroid.  SKIN: Skin is warm and dry. No rash noted. Not diaphoretic. No erythema. No pallor. [place]: Alert and oriented to person, place, and time. PSYCHIATRIC: Normal mood and affect. Normal behavior. Normal judgment and thought content. CARDIOVASCULAR:Not Examined BACK: No CVA or spinal tenderness RESPIRATORY: Not Examined BREASTS: Not Examined ABDOMEN: Soft, non distended; Non tender.  No Organomegaly. PELVIC:  External Genitalia: Normal  BUS: Normal  Vagina: Normal estrogen effect; first degree cystocele  Cervix: Normal; no lesions; no cervical motion tenderness; prolapsed to within 4 cm of the introitus  Uterus: Normal size, shape,consistency, mobile, midplane, nonenlarged  Adnexa: Normal; nonpalpable nontender  RV: Normal external exam;  normal sphincter tone; moderate rectocele is confirmed on internal exam  Bladder: Nontender MUSCULOSKELETAL: Normal range of motion. No tenderness.  No cyanosis, clubbing, or edema.     Assessment:   1. Cystocele with incomplete uterovaginal prolapse, associated with incomplete bladder emptying  2. Rectocele, slightly symptomatic  3. Class 2 severe obesity due to excess calories with serious comorbidity in adult, unspecified BMI ([place])  4.  Irritable bowel syndrome with constipation   5.  Climacteric     Plan:   1.  Options of management of uterovaginal prolapse with associated cystocele and rectocele were reviewed.  Conservative nonsurgical measures were discussed and not desired.  Surgical intervention is desired. 2.  Laparoscopic-assisted vaginal hysterectomy with bilateral salpingectomy and anterior colporrhaphy is scheduled for June 2019.  Rectocele will not  be repaired due to the mild symptoms. 3.  Patient is return 1 week prior to surgery for preop appointment 4.  Literature regarding LAVH is given to patient  A total of 30 minutes were spent face-to-face with the patient during the encounter with greater than 50% dealing with counseling and coordination of care.  [name], MD  Note: This dictation was prepared with Dragon dictation along with smaller phrase technology. Any transcriptional errors that result from this process are unintentional.

## 2018-04-06 NOTE — Patient Instructions (Signed)
1.  Laparoscopic assisted vaginal hysterectomy and bilateral salpingectomy along with anterior colporrhaphy (bladder prolapse repair) is scheduled for June 2019 2.  Return 1 week before surgery for preop appointment 3.  Recommend increased water intake along with Colace and Metamucil for helping with chronic constipation/irritable bowel  Laparoscopically Assisted Vaginal Hysterectomy, Care After Refer to this sheet in the next few weeks. These instructions provide you with information on caring for yourself after your procedure. Your health care provider may also give you more specific instructions. Your treatment has been planned according to current medical practices, but problems sometimes occur. Call your health care provider if you have any problems or questions after your procedure. What can I expect after the procedure? After your procedure, it is typical to have the following:  Abdominal pain. You will be given pain medicine to control it.  Sore throat from the breathing tube that was inserted during surgery.  Follow these instructions at home:  Only take over-the-counter or prescription medicines for pain, discomfort, or fever as directed by your health care provider.  Do not take aspirin. It can cause bleeding.  Do not drive when taking pain medicine.  Follow your health care provider's advice regarding diet, exercise, lifting, driving, and general activities.  Resume your usual diet as directed and allowed.  Get plenty of rest and sleep.  Do not douche, use tampons, or have sexual intercourse for at least 6 weeks, or until your health care provider gives you permission.  Change your bandages (dressings) as directed by your health care provider.  Monitor your temperature and notify your health care provider of a fever.  Take showers instead of baths for 2-3 weeks.  Do not drink alcohol until your health care provider gives you permission.  If you develop constipation,  you may take a mild laxative with your health care provider's permission. Bran foods may help with constipation problems. Drinking enough fluids to keep your urine clear or pale yellow may help as well.  Try to have someone home with you for 1-2 weeks to help around the house.  Keep all of your follow-up appointments as directed by your health care provider. Contact a health care provider if:  You have swelling, redness, or increasing pain around your incision sites.  You have pus coming from your incision.  You notice a bad smell coming from your incision.  Your incision breaks open.  You feel dizzy or lightheaded.  You have pain or bleeding when you urinate.  You have persistent diarrhea.  You have persistent nausea and vomiting.  You have abnormal vaginal discharge.  You have a rash.  You have any type of abnormal reaction or develop an allergy to your medicine.  You have poor pain control with your prescribed medicine. Get help right away if:  You have a fever.  You have severe abdominal pain.  You have chest pain.  You have shortness of breath.  You faint.  You have pain, swelling, or redness in your leg.  You have heavy vaginal bleeding with blood clots. This information is not intended to replace advice given to you by your health care provider. Make sure you discuss any questions you have with your health care provider. Document Released: 11/12/2011 Document Revised: 04/30/2016 Document Reviewed: 06/08/2013 Elsevier Interactive Patient Education  2017 [employer].

## 2018-04-27 LAB — HM MAMMOGRAPHY

## 2018-05-04 ENCOUNTER — Ambulatory Visit (INDEPENDENT_AMBULATORY_CARE_PROVIDER_SITE_OTHER): Payer: BLUE CROSS/BLUE SHIELD | Admitting: Obstetrics and Gynecology

## 2018-05-04 ENCOUNTER — Telehealth: Payer: Self-pay | Admitting: Family Medicine

## 2018-05-04 ENCOUNTER — Encounter: Payer: Self-pay | Admitting: Obstetrics and Gynecology

## 2018-05-04 ENCOUNTER — Encounter: Payer: Self-pay | Admitting: Family Medicine

## 2018-05-04 VITALS — BP 97/67 | HR 79 | Ht 66.0 in | Wt 251.1 lb

## 2018-05-04 DIAGNOSIS — Z01818 Encounter for other preprocedural examination: Secondary | ICD-10-CM

## 2018-05-04 DIAGNOSIS — N816 Rectocele: Secondary | ICD-10-CM

## 2018-05-04 DIAGNOSIS — N812 Incomplete uterovaginal prolapse: Secondary | ICD-10-CM

## 2018-05-04 NOTE — Telephone Encounter (Signed)
Pt called back, attempted to get someone at the office, no answer

## 2018-05-04 NOTE — H&P (View-Only) (Signed)
PREOPERATIVE HISTORY AND PHYSICAL  Date of surgery: 05/09/2018 Diagnosis: Symptomatic pelvic organ prolapse Procedure: LAVH bilateral salpingectomy and anterior colporrhaphy    Patient is a 47 y.o. G1P0075female scheduled for LAVH bilateral salpingectomy with anterior colporrhaphy on 05/09/2018.  Patient desires surgical management of this condition due to symptoms of pelvic pressure, worse with sitting, rest with standing or lying supine.  She does have incomplete bladder emptying, and at work this is most problematic when she squats to void.  Casey Colon does not have significant nocturia.  She does have urinary frequency approximately 6-8 times a day. There is a history of irritable bowel syndrome of long-term duration; she does have rectocele on exam but does control her bowel movement frequency on a daily basis with MiraLAX use.  She does splint minimally for defecation.  Pertinent Gynecological History: Patient's last menstrual period was 04/20/2018 (exact date). Contraception: abstinence  Pap smear--03/01/2018  Menstrual history: January 2019-1-day February 2019 none March 2019-7 days with clots April 2019-7 days heavy without clots    OB History    Gravida  1   Para      Term      Preterm      AB  1   Living        SAB      TAB  1   Ectopic      Multiple      Live Births               Patient's last menstrual period was 04/20/2018 (exact date).    Past Medical History:  Diagnosis Date  . Chronic headaches   . Edema   . GERD (gastroesophageal reflux disease)   . IBS (irritable bowel syndrome)     Past Surgical History:  Procedure Laterality Date  . NO PAST SURGERIES      OB History  Gravida Para Term Preterm AB Living  1       1    SAB TAB Ectopic Multiple Live Births    1          # Outcome Date GA Lbr Len/2nd Weight Sex Delivery Anes PTL Lv  1 TAB 1993            Social History   Socioeconomic History  . Marital status: Married    Spouse  name: Not on file  . Number of children: Not on file  . Years of education: Not on file  . Highest education level: Not on file  Occupational History  . Not on file  Social Needs  . Financial resource strain: Not on file  . Food insecurity:    Worry: Not on file    Inability: Not on file  . Transportation needs:    Medical: Not on file    Non-medical: Not on file  Tobacco Use  . Smoking status: Never Smoker  . Smokeless tobacco: Never Used  Substance and Sexual Activity  . Alcohol use: No  . Drug use: No  . Sexual activity: Yes    Comment: same sex  Lifestyle  . Physical activity:    Days per week: Not on file    Minutes per session: Not on file  . Stress: Not on file  Relationships  . Social connections:    Talks on phone: Not on file    Gets together: Not on file    Attends religious service: Not on file    Active member of club or organization: Not on file  Attends meetings of clubs or organizations: Not on file    Relationship status: Not on file  Other Topics Concern  . Not on file  Social History Narrative  . Not on file    Family History  Problem Relation Age of Onset  . Asthma Maternal Grandmother   . Ovarian cancer Neg Hx   . Colon cancer Neg Hx      (Not in a hospital admission)  Allergies  Allergen Reactions  . Dilaudid [Hydromorphone Hcl] Other (See Comments)    Chest heaviness/difficulty breathing  . Zoloft [Sertraline] Other (See Comments)    anger    Review of Systems Constitutional: No recent fever/chills/sweats Respiratory: No recent cough/bronchitis Cardiovascular: No chest pain Gastrointestinal: No recent nausea/vomiting/diarrhea Genitourinary: No UTI symptoms Hematologic/lymphatic:No history of coagulopathy or recent blood thinner use    Objective:    BP 97/67   Pulse 79   Ht 5\' 6"  (1.676 m)   Wt 251 lb 1.6 oz (113.9 kg)   LMP 04/20/2018 (Exact Date)   BMI 40.53 kg/m   General:   Normal  Skin:   normal  HEENT:   Normal  Neck:  Supple without Adenopathy or Thyromegaly  Lungs:   Heart:              Breasts:   Abdomen:  Pelvis:  M/S   Extremeties:  Neuro:    clear to auscultation bilaterally   Normal without murmur   Not Examined   soft, non-tender; bowel sounds normal; no masses,  no organomegaly   Exam deferred to OR  No CVAT  Warm/Dry   Normal        5/1/2019PELVIC:             External Genitalia: Normal             BUS: Normal             Vagina: Normal estrogen effect; first degree cystocele             Cervix: Normal; no lesions; no cervical motion tenderness; prolapsed to within 4 cm of the introitus             Uterus: Normal size, shape,consistency, mobile, midplane, nonenlarged             Adnexa: Normal; nonpalpable nontender             RV: Normal external exam; normal sphincter tone; moderate rectocele is confirmed on internal exam    Assessment:    1.  Cystocele with incomplete uterovaginal prolapse, associated with incomplete bladder emptying 2.  Rectocele, mildly symptomatic, does not desire repair 3.  Obesity 4.  History of irritable bowel syndrome with constipation   Plan:   LAVH bilateral salpingectomy with anterior colporrhaphy  Preop counseling: Patient is to undergo LAVH bilateral salpingectomy with anterior atrophy on 05/09/2018.  She is understanding of the planned procedures and is aware of and is accepting of all surgical risks which include but are not limited to bleeding, infection, pelvic organ injury with need for repair, blood clot disorders, anesthesia risk, etc.  All questions have been answered.  Informed consent was given.  Patient is very willing to proceed with surgery as scheduled.  [name], MD  Note: This dictation was prepared with Dragon dictation along with smaller phrase technology. Any transcriptional errors that result from this process are uninten

## 2018-05-04 NOTE — Progress Notes (Signed)
PREOPERATIVE HISTORY AND PHYSICAL  Date of surgery: 05/09/2018 Diagnosis: Symptomatic pelvic organ prolapse Procedure: LAVH bilateral salpingectomy and anterior colporrhaphy    Patient is a 47 y.o. G1P0066female scheduled for LAVH bilateral salpingectomy with anterior colporrhaphy on 05/09/2018.  Patient desires surgical management of this condition due to symptoms of pelvic pressure, worse with sitting, rest with standing or lying supine.  She does have incomplete bladder emptying, and at work this is most problematic when she squats to void.  Casey Colon does not have significant nocturia.  She does have urinary frequency approximately 6-8 times a day. There is a history of irritable bowel syndrome of long-term duration; she does have rectocele on exam but does control her bowel movement frequency on a daily basis with MiraLAX use.  She does splint minimally for defecation.  Pertinent Gynecological History: Patient's last menstrual period was 04/20/2018 (exact date). Contraception: abstinence  Pap smear--03/01/2018  Menstrual history: January 2019-1-day February 2019 none March 2019-7 days with clots April 2019-7 days heavy without clots    OB History    Gravida  1   Para      Term      Preterm      AB  1   Living        SAB      TAB  1   Ectopic      Multiple      Live Births               Patient's last menstrual period was 04/20/2018 (exact date).    Past Medical History:  Diagnosis Date  . Chronic headaches   . Edema   . GERD (gastroesophageal reflux disease)   . IBS (irritable bowel syndrome)     Past Surgical History:  Procedure Laterality Date  . NO PAST SURGERIES      OB History  Gravida Para Term Preterm AB Living  1       1    SAB TAB Ectopic Multiple Live Births    1          # Outcome Date GA Lbr Len/2nd Weight Sex Delivery Anes PTL Lv  1 TAB 1993            Social History   Socioeconomic History  . Marital status: Married    Spouse  name: Not on file  . Number of children: Not on file  . Years of education: Not on file  . Highest education level: Not on file  Occupational History  . Not on file  Social Needs  . Financial resource strain: Not on file  . Food insecurity:    Worry: Not on file    Inability: Not on file  . Transportation needs:    Medical: Not on file    Non-medical: Not on file  Tobacco Use  . Smoking status: Never Smoker  . Smokeless tobacco: Never Used  Substance and Sexual Activity  . Alcohol use: No  . Drug use: No  . Sexual activity: Yes    Comment: same sex  Lifestyle  . Physical activity:    Days per week: Not on file    Minutes per session: Not on file  . Stress: Not on file  Relationships  . Social connections:    Talks on phone: Not on file    Gets together: Not on file    Attends religious service: Not on file    Active member of club or organization: Not on file  Attends meetings of clubs or organizations: Not on file    Relationship status: Not on file  Other Topics Concern  . Not on file  Social History Narrative  . Not on file    Family History  Problem Relation Age of Onset  . Asthma Maternal Grandmother   . Ovarian cancer Neg Hx   . Colon cancer Neg Hx      (Not in a hospital admission)  Allergies  Allergen Reactions  . Dilaudid [Hydromorphone Hcl] Other (See Comments)    Chest heaviness/difficulty breathing  . Zoloft [Sertraline] Other (See Comments)    anger    Review of Systems Constitutional: No recent fever/chills/sweats Respiratory: No recent cough/bronchitis Cardiovascular: No chest pain Gastrointestinal: No recent nausea/vomiting/diarrhea Genitourinary: No UTI symptoms Hematologic/lymphatic:No history of coagulopathy or recent blood thinner use    Objective:    BP 97/67   Pulse 79   Ht 5\' 6"  (1.676 m)   Wt 251 lb 1.6 oz (113.9 kg)   LMP 04/20/2018 (Exact Date)   BMI 40.53 kg/m   General:   Normal  Skin:   normal  HEENT:   Normal  Neck:  Supple without Adenopathy or Thyromegaly  Lungs:   Heart:              Breasts:   Abdomen:  Pelvis:  M/S   Extremeties:  Neuro:    clear to auscultation bilaterally   Normal without murmur   Not Examined   soft, non-tender; bowel sounds normal; no masses,  no organomegaly   Exam deferred to OR  No CVAT  Warm/Dry   Normal        5/1/2019PELVIC:             External Genitalia: Normal             BUS: Normal             Vagina: Normal estrogen effect; first degree cystocele             Cervix: Normal; no lesions; no cervical motion tenderness; prolapsed to within 4 cm of the introitus             Uterus: Normal size, shape,consistency, mobile, midplane, nonenlarged             Adnexa: Normal; nonpalpable nontender             RV: Normal external exam; normal sphincter tone; moderate rectocele is confirmed on internal exam    Assessment:    1.  Cystocele with incomplete uterovaginal prolapse, associated with incomplete bladder emptying 2.  Rectocele, mildly symptomatic, does not desire repair 3.  Obesity 4.  History of irritable bowel syndrome with constipation   Plan:   LAVH bilateral salpingectomy with anterior colporrhaphy  Preop counseling: Patient is to undergo LAVH bilateral salpingectomy with anterior atrophy on 05/09/2018.  She is understanding of the planned procedures and is aware of and is accepting of all surgical risks which include but are not limited to bleeding, infection, pelvic organ injury with need for repair, blood clot disorders, anesthesia risk, etc.  All questions have been answered.  Informed consent was given.  Patient is very willing to proceed with surgery as scheduled.  [name], MD  Note: This dictation was prepared with Dragon dictation along with smaller phrase technology. Any transcriptional errors that result from this process are unintentional.

## 2018-05-04 NOTE — Patient Instructions (Signed)
1.  Preop appointment is completed today. 2.  Return 3 weeks after surgery for postop check as scheduled.

## 2018-05-04 NOTE — H&P (Signed)
PREOPERATIVE HISTORY AND PHYSICAL  Date of surgery: 05/09/2018 Diagnosis: Symptomatic pelvic organ prolapse Procedure: LAVH bilateral salpingectomy and anterior colporrhaphy    Patient is a 47 y.o. G1P0014female scheduled for LAVH bilateral salpingectomy with anterior colporrhaphy on 05/09/2018.  Patient desires surgical management of this condition due to symptoms of pelvic pressure, worse with sitting, rest with standing or lying supine.  She does have incomplete bladder emptying, and at work this is most problematic when she squats to void.  Casey Colon does not have significant nocturia.  She does have urinary frequency approximately 6-8 times a day. There is a history of irritable bowel syndrome of long-term duration; she does have rectocele on exam but does control her bowel movement frequency on a daily basis with MiraLAX use.  She does splint minimally for defecation.  Pertinent Gynecological History: Patient's last menstrual period was 04/20/2018 (exact date). Contraception: abstinence  Pap smear--03/01/2018  Menstrual history: January 2019-1-day February 2019 none March 2019-7 days with clots April 2019-7 days heavy without clots    OB History    Gravida  1   Para      Term      Preterm      AB  1   Living        SAB      TAB  1   Ectopic      Multiple      Live Births               Patient's last menstrual period was 04/20/2018 (exact date).    Past Medical History:  Diagnosis Date  . Chronic headaches   . Edema   . GERD (gastroesophageal reflux disease)   . IBS (irritable bowel syndrome)     Past Surgical History:  Procedure Laterality Date  . NO PAST SURGERIES      OB History  Gravida Para Term Preterm AB Living  1       1    SAB TAB Ectopic Multiple Live Births    1          # Outcome Date GA Lbr Len/2nd Weight Sex Delivery Anes PTL Lv  1 TAB 1993            Social History   Socioeconomic History  . Marital status: Married    Spouse  name: Not on file  . Number of children: Not on file  . Years of education: Not on file  . Highest education level: Not on file  Occupational History  . Not on file  Social Needs  . Financial resource strain: Not on file  . Food insecurity:    Worry: Not on file    Inability: Not on file  . Transportation needs:    Medical: Not on file    Non-medical: Not on file  Tobacco Use  . Smoking status: Never Smoker  . Smokeless tobacco: Never Used  Substance and Sexual Activity  . Alcohol use: No  . Drug use: No  . Sexual activity: Yes    Comment: same sex  Lifestyle  . Physical activity:    Days per week: Not on file    Minutes per session: Not on file  . Stress: Not on file  Relationships  . Social connections:    Talks on phone: Not on file    Gets together: Not on file    Attends religious service: Not on file    Active member of club or organization: Not on file  Attends meetings of clubs or organizations: Not on file    Relationship status: Not on file  Other Topics Concern  . Not on file  Social History Narrative  . Not on file    Family History  Problem Relation Age of Onset  . Asthma Maternal Grandmother   . Ovarian cancer Neg Hx   . Colon cancer Neg Hx      (Not in a hospital admission)  Allergies  Allergen Reactions  . Dilaudid [Hydromorphone Hcl] Other (See Comments)    Chest heaviness/difficulty breathing  . Zoloft [Sertraline] Other (See Comments)    anger    Review of Systems Constitutional: No recent fever/chills/sweats Respiratory: No recent cough/bronchitis Cardiovascular: No chest pain Gastrointestinal: No recent nausea/vomiting/diarrhea Genitourinary: No UTI symptoms Hematologic/lymphatic:No history of coagulopathy or recent blood thinner use    Objective:    BP 97/67   Pulse 79   Ht 5\' 6"  (1.676 m)   Wt 251 lb 1.6 oz (113.9 kg)   LMP 04/20/2018 (Exact Date)   BMI 40.53 kg/m   General:   Normal  Skin:   normal  HEENT:   Normal  Neck:  Supple without Adenopathy or Thyromegaly  Lungs:   Heart:              Breasts:   Abdomen:  Pelvis:  M/S   Extremeties:  Neuro:    clear to auscultation bilaterally   Normal without murmur   Not Examined   soft, non-tender; bowel sounds normal; no masses,  no organomegaly   Exam deferred to OR  No CVAT  Warm/Dry   Normal        5/1/2019PELVIC:             External Genitalia: Normal             BUS: Normal             Vagina: Normal estrogen effect; first degree cystocele             Cervix: Normal; no lesions; no cervical motion tenderness; prolapsed to within 4 cm of the introitus             Uterus: Normal size, shape,consistency, mobile, midplane, nonenlarged             Adnexa: Normal; nonpalpable nontender             RV: Normal external exam; normal sphincter tone; moderate rectocele is confirmed on internal exam    Assessment:    1.  Cystocele with incomplete uterovaginal prolapse, associated with incomplete bladder emptying 2.  Rectocele, mildly symptomatic, does not desire repair 3.  Obesity 4.  History of irritable bowel syndrome with constipation   Plan:   LAVH bilateral salpingectomy with anterior colporrhaphy  Preop counseling: Patient is to undergo LAVH bilateral salpingectomy with anterior atrophy on 05/09/2018.  She is understanding of the planned procedures and is aware of and is accepting of all surgical risks which include but are not limited to bleeding, infection, pelvic organ injury with need for repair, blood clot disorders, anesthesia risk, etc.  All questions have been answered.  Informed consent was given.  Patient is very willing to proceed with surgery as scheduled.  [name], MD  Note: This dictation was prepared with Dragon dictation along with smaller phrase technology. Any transcriptional errors that result from this process are uninten

## 2018-05-04 NOTE — Telephone Encounter (Signed)
I attempted to contact the patient regarding her mammogram results.  There is no answer.  I left a message asking her to call back to the office.  Please try calling the patient and find out if she has been contacted regarding her mammogram results yet.  If not please let me speak with her.  Thanks.

## 2018-05-05 ENCOUNTER — Other Ambulatory Visit: Payer: Self-pay

## 2018-05-05 ENCOUNTER — Encounter
Admission: RE | Admit: 2018-05-05 | Discharge: 2018-05-05 | Disposition: A | Discharge status: Home / Self Care | Payer: BLUE CROSS/BLUE SHIELD | Source: Ambulatory Visit | Attending: Obstetrics and Gynecology | Admitting: Obstetrics and Gynecology

## 2018-05-05 DIAGNOSIS — N812 Incomplete uterovaginal prolapse: Secondary | ICD-10-CM | POA: Insufficient documentation

## 2018-05-05 DIAGNOSIS — Z01818 Encounter for other preprocedural examination: Secondary | ICD-10-CM | POA: Insufficient documentation

## 2018-05-05 LAB — CBC WITH DIFFERENTIAL/PLATELET
Basophils Absolute: 0.1 10*3/uL (ref 0–0.1)
Basophils Relative: 1 %
Eosinophils Absolute: 0.2 10*3/uL (ref 0–0.7)
Eosinophils Relative: 2 %
HCT: 43.8 % (ref 35.0–47.0)
Hemoglobin: 14.6 g/dL (ref 12.0–16.0)
Lymphocytes Relative: 29 %
Lymphs Abs: 2.4 10*3/uL (ref 1.0–3.6)
MCH: 28.9 pg (ref 26.0–34.0)
MCHC: 33.3 g/dL (ref 32.0–36.0)
MCV: 86.7 fL (ref 80.0–100.0)
Monocytes Absolute: 0.8 10*3/uL (ref 0.2–0.9)
Monocytes Relative: 9 %
Neutro Abs: 5 10*3/uL (ref 1.4–6.5)
Neutrophils Relative %: 59 %
Platelets: 208 10*3/uL (ref 150–440)
RBC: 5.05 MIL/uL (ref 3.80–5.20)
RDW: 13.3 % (ref 11.5–14.5)
WBC: 8.4 10*3/uL (ref 3.6–11.0)

## 2018-05-05 LAB — TYPE AND SCREEN
ABO/RH(D): O POS
Antibody Screen: NEGATIVE

## 2018-05-05 LAB — RAPID HIV SCREEN (HIV 1/2 AB+AG)
HIV 1/2 Antibodies: NONREACTIVE
HIV-1 P24 Antigen - HIV24: NONREACTIVE

## 2018-05-05 NOTE — Telephone Encounter (Signed)
I spoke with the patient earlier this morning regarding her mammogram results.  Advised that there was a distortion in the right breast.  Discussed that oftentimes this ends up being a benign finding though it needs further evaluation to determine whether or not there is a concerning finding at that spot.  She was advised that she would be contacted by the [facility] to set up follow-up images.  If she does not hear from them in the next week she will contact us.

## 2018-05-05 NOTE — Telephone Encounter (Signed)
Left message to return call 

## 2018-05-05 NOTE — Patient Instructions (Signed)
Your procedure is scheduled on: Monday 05/09/18 Report to [place]. To find out your arrival time please call [phone] between 1PM - 3PM on Friday 05/06/18.  Remember: Instructions that are not followed completely may result in serious medical risk, up to and including death, or upon the discretion of your surgeon and anesthesiologist your surgery may need to be rescheduled.     _X__ 1. Do not eat food after midnight the night before your procedure.                 No gum chewing or hard candies. You may drink clear liquids up to 2 hours                 before you are scheduled to arrive for your surgery- DO not drink clear                 liquids within 2 hours of the start of your surgery.                 Clear Liquids include:  water, apple juice without pulp, clear carbohydrate                 drink such as Clearfast or Gatorade, Black Coffee or Tea (Do not add                 anything to coffee or tea).  __X__2.  On the morning of surgery brush your teeth with toothpaste and water, you                 may rinse your mouth with mouthwash if you wish.  Do not swallow any              toothpaste of mouthwash.     _X__ 3.  No Alcohol for 24 hours before or after surgery.   _X__ 4.  Do Not Smoke or use e-cigarettes For 24 Hours Prior to Your Surgery.                 Do not use any chewable tobacco products for at least 6 hours prior to                 surgery.  ____  5.  Bring all medications with you on the day of surgery if instructed.   __X__  6.  Notify your doctor if there is any change in your medical condition      (cold, fever, infections).     Do not wear jewelry, make-up, hairpins, clips or nail polish. Do not wear lotions, powders, or perfumes.  Do not shave 48 hours prior to surgery. Men may shave face and neck. Do not bring valuables to the hospital.    [facility] is not responsible for any belongings or  valuables.  Contacts, dentures/partials or body piercings may not be worn into surgery. Bring a case for your contacts, glasses or hearing aids, a denture cup will be supplied. Leave your suitcase in the car. After surgery it may be brought to your room. For patients admitted to the hospital, discharge time is determined by your treatment team.   Patients discharged the day of surgery will not be allowed to drive home.   Please read over the following fact sheets that you were given:   MRSA Information  __X__ Take these medicines the morning of surgery with A SIP OF WATER:  1. Ranitidine as usual  2. May take allegra if needed  3.   4.  5.  6.  ____ Fleet Enema (as directed)   __X__ Use CHG Soap/SAGE wipes as directed  ____ Use inhalers on the day of surgery  ____ Stop metformin/Janumet/Farxiga 2 days prior to surgery    ____ Take 1/2 of usual insulin dose the night before surgery. No insulin the morning          of surgery.   __X__ Stop Blood Thinners Coumadin/Plavix/Xarelto/Pleta/Pradaxa/Eliquis/Effient/Aspirin  on TODAY  Or contact your Surgeon, Cardiologist or Medical Doctor regarding  ability to stop your blood thinners    STOP EXCEDRIN TODAY  __X__ Stop Anti-inflammatories 7 days before surgery such as Advil, Ibuprofen, Motrin,  BC or Goodies Powder, Naprosyn, Naproxen, Aleve, Aspirin  (TODAY)   __X__ Stop all herbal supplements, fish oil or vitamin E until after surgery. TODAY   ____ Bring C-Pap to the hospital.

## 2018-05-06 LAB — RPR: RPR Ser Ql: NONREACTIVE

## 2018-05-07 NOTE — Anesthesia Preprocedure Evaluation (Addendum)
Anesthesia Evaluation  Patient identified by MRN, date of birth, ID band Patient awake    Reviewed: Allergy & Precautions, H&P , NPO status , reviewed documented beta blocker date and time   Airway Mallampati: II  TM Distance: >3 FB Neck ROM: full    Dental  (+) Teeth Intact   Pulmonary    Pulmonary exam normal        Cardiovascular      Neuro/Psych  Headaches,    GI/Hepatic GERD  Controlled,  Endo/Other  Morbid obesity  Renal/GU      Musculoskeletal   Abdominal   Peds  Hematology   Anesthesia Other Findings Past Medical History: No date: Chronic headaches No date: Edema No date: GERD (gastroesophageal reflux disease) No date: IBS (irritable bowel syndrome) Past Surgical History: No date: NO PAST SURGERIES   Reproductive/Obstetrics                            Anesthesia Physical Anesthesia Plan  ASA: III  Anesthesia Plan: General ETT   Post-op Pain Management:    Induction: Intravenous  PONV Risk Score and Plan: Ondansetron, Treatment may vary due to age or medical condition, Midazolam and Dexamethasone  Airway Management Planned:   Additional Equipment:   Intra-op Plan:   Post-operative Plan: Extubation in OR  Informed Consent: I have reviewed the patients History and Physical, chart, labs and discussed the procedure including the risks, benefits and alternatives for the proposed anesthesia with the patient or authorized representative who has indicated his/her understanding and acceptance.   Dental Advisory Given  Plan Discussed with: CRNA  Anesthesia Plan Comments:        Anesthesia Quick Evaluation

## 2018-05-08 MED ORDER — CEFAZOLIN SODIUM-DEXTROSE 2-4 GM/100ML-% IV SOLN
2.0000 g | INTRAVENOUS | Status: AC
  Administered 2018-05-09: 2 g via INTRAVENOUS

## 2018-05-09 ENCOUNTER — Encounter
Admission: RE | Disposition: A | Discharge status: Home / Self Care | Payer: Self-pay | Source: Ambulatory Visit | Attending: Obstetrics and Gynecology

## 2018-05-09 ENCOUNTER — Ambulatory Visit: Payer: BLUE CROSS/BLUE SHIELD | Admitting: Anesthesiology

## 2018-05-09 ENCOUNTER — Other Ambulatory Visit: Payer: Self-pay

## 2018-05-09 ENCOUNTER — Observation Stay
Admission: RE | Admit: 2018-05-09 | Discharge: 2018-05-10 | Disposition: A | Discharge status: Home / Self Care | Payer: BLUE CROSS/BLUE SHIELD | Source: Ambulatory Visit | Attending: Obstetrics and Gynecology | Admitting: Obstetrics and Gynecology

## 2018-05-09 DIAGNOSIS — N888 Other specified noninflammatory disorders of cervix uteri: Secondary | ICD-10-CM | POA: Insufficient documentation

## 2018-05-09 DIAGNOSIS — K219 Gastro-esophageal reflux disease without esophagitis: Secondary | ICD-10-CM | POA: Diagnosis not present

## 2018-05-09 DIAGNOSIS — Z6841 Body Mass Index (BMI) 40.0 and over, adult: Secondary | ICD-10-CM | POA: Insufficient documentation

## 2018-05-09 DIAGNOSIS — D250 Submucous leiomyoma of uterus: Secondary | ICD-10-CM

## 2018-05-09 DIAGNOSIS — N814 Uterovaginal prolapse, unspecified: Secondary | ICD-10-CM | POA: Diagnosis not present

## 2018-05-09 DIAGNOSIS — Z79899 Other long term (current) drug therapy: Secondary | ICD-10-CM | POA: Insufficient documentation

## 2018-05-09 DIAGNOSIS — D259 Leiomyoma of uterus, unspecified: Secondary | ICD-10-CM | POA: Diagnosis not present

## 2018-05-09 DIAGNOSIS — K589 Irritable bowel syndrome without diarrhea: Secondary | ICD-10-CM | POA: Diagnosis not present

## 2018-05-09 DIAGNOSIS — Z90710 Acquired absence of both cervix and uterus: Secondary | ICD-10-CM

## 2018-05-09 HISTORY — PX: LAPAROSCOPIC VAGINAL HYSTERECTOMY WITH SALPINGECTOMY: SHX6680

## 2018-05-09 LAB — CBC WITH DIFFERENTIAL/PLATELET
Basophils Absolute: 0.1 10*3/uL (ref 0–0.1)
Basophils Relative: 1 %
Eosinophils Absolute: 0.1 10*3/uL (ref 0–0.7)
Eosinophils Relative: 2 %
HCT: 41.6 % (ref 35.0–47.0)
Hemoglobin: 14.1 g/dL (ref 12.0–16.0)
Lymphocytes Relative: 25 %
Lymphs Abs: 2.2 10*3/uL (ref 1.0–3.6)
MCH: 29.7 pg (ref 26.0–34.0)
MCHC: 33.9 g/dL (ref 32.0–36.0)
MCV: 87.7 fL (ref 80.0–100.0)
Monocytes Absolute: 0.8 10*3/uL (ref 0.2–0.9)
Monocytes Relative: 9 %
Neutro Abs: 5.7 10*3/uL (ref 1.4–6.5)
Neutrophils Relative %: 65 %
Platelets: 211 10*3/uL (ref 150–440)
RBC: 4.74 MIL/uL (ref 3.80–5.20)
RDW: 13.7 % (ref 11.5–14.5)
WBC: 8.8 10*3/uL (ref 3.6–11.0)

## 2018-05-09 LAB — RAPID HIV SCREEN (HIV 1/2 AB+AG)
HIV 1/2 Antibodies: NONREACTIVE
HIV-1 P24 Antigen - HIV24: NONREACTIVE

## 2018-05-09 LAB — POCT PREGNANCY, URINE: Preg Test, Ur: NEGATIVE

## 2018-05-09 LAB — ABO/RH: ABO/RH(D): O POS

## 2018-05-09 SURGERY — HYSTERECTOMY, VAGINAL, LAPAROSCOPY-ASSISTED, WITH SALPINGECTOMY
Anesthesia: General | Wound class: Clean Contaminated

## 2018-05-09 MED ORDER — LIDOCAINE HCL (PF) 2 % IJ SOLN
INTRAMUSCULAR | Status: AC
  Filled 2018-05-09: qty 10

## 2018-05-09 MED ORDER — FENTANYL CITRATE (PF) 100 MCG/2ML IJ SOLN
INTRAMUSCULAR | Status: AC
  Filled 2018-05-09: qty 2

## 2018-05-09 MED ORDER — FENTANYL CITRATE (PF) 100 MCG/2ML IJ SOLN
INTRAMUSCULAR | Status: AC
  Administered 2018-05-09: 50 ug via INTRAVENOUS
  Filled 2018-05-09: qty 2

## 2018-05-09 MED ORDER — ACETAMINOPHEN 160 MG/5ML PO SOLN
325.0000 mg | ORAL | Status: DC | PRN
  Filled 2018-05-09: qty 20.3

## 2018-05-09 MED ORDER — FENTANYL CITRATE (PF) 100 MCG/2ML IJ SOLN
INTRAMUSCULAR | Status: DC | PRN
  Administered 2018-05-09 (×2): 50 ug via INTRAVENOUS
  Administered 2018-05-09: 150 ug via INTRAVENOUS

## 2018-05-09 MED ORDER — ACETAMINOPHEN NICU IV SYRINGE 10 MG/ML
INTRAVENOUS | Status: AC
  Filled 2018-05-09: qty 1

## 2018-05-09 MED ORDER — SIMETHICONE 80 MG PO CHEW: 80.0000 mg | CHEWABLE_TABLET | Freq: Four times a day (QID) | ORAL | Status: DC | PRN

## 2018-05-09 MED ORDER — HYDROCODONE-ACETAMINOPHEN 5-325 MG PO TABS
1.0000 | ORAL_TABLET | Freq: Four times a day (QID) | ORAL | Status: DC | PRN
  Administered 2018-05-09 – 2018-05-10 (×3): 2 via ORAL
  Filled 2018-05-09 (×3): qty 2

## 2018-05-09 MED ORDER — PROPOFOL 10 MG/ML IV BOLUS
INTRAVENOUS | Status: AC
  Filled 2018-05-09: qty 20

## 2018-05-09 MED ORDER — KETOROLAC TROMETHAMINE 30 MG/ML IJ SOLN
INTRAMUSCULAR | Status: AC
  Filled 2018-05-09: qty 1

## 2018-05-09 MED ORDER — CEFAZOLIN SODIUM-DEXTROSE 2-4 GM/100ML-% IV SOLN: 2.0000 g | INTRAVENOUS | Status: DC

## 2018-05-09 MED ORDER — FENTANYL CITRATE (PF) 100 MCG/2ML IJ SOLN
25.0000 ug | INTRAMUSCULAR | Status: DC | PRN
Start: 2018-05-09 — End: 2018-05-09
  Administered 2018-05-09 (×3): 50 ug via INTRAVENOUS

## 2018-05-09 MED ORDER — ONDANSETRON HCL 4 MG/2ML IJ SOLN
INTRAMUSCULAR | Status: AC
  Filled 2018-05-09: qty 2

## 2018-05-09 MED ORDER — ROCURONIUM BROMIDE 50 MG/5ML IV SOLN
INTRAVENOUS | Status: AC
  Filled 2018-05-09: qty 2

## 2018-05-09 MED ORDER — ACETAMINOPHEN 10 MG/ML IV SOLN
INTRAVENOUS | Status: DC | PRN
  Administered 2018-05-09: 1000 mg via INTRAVENOUS

## 2018-05-09 MED ORDER — BISACODYL 10 MG RE SUPP: 10.0000 mg | Freq: Every day | RECTAL | Status: DC | PRN

## 2018-05-09 MED ORDER — MIDAZOLAM HCL 2 MG/2ML IJ SOLN
INTRAMUSCULAR | Status: AC
  Filled 2018-05-09: qty 2

## 2018-05-09 MED ORDER — SUGAMMADEX SODIUM 500 MG/5ML IV SOLN
INTRAVENOUS | Status: AC
  Filled 2018-05-09: qty 5

## 2018-05-09 MED ORDER — PROMETHAZINE HCL 25 MG/ML IJ SOLN
INTRAMUSCULAR | Status: AC
  Filled 2018-05-09: qty 1

## 2018-05-09 MED ORDER — KETOROLAC TROMETHAMINE 30 MG/ML IJ SOLN
30.0000 mg | Freq: Four times a day (QID) | INTRAMUSCULAR | Status: DC
  Administered 2018-05-09 (×3): 30 mg via INTRAVENOUS
  Filled 2018-05-09 (×3): qty 1

## 2018-05-09 MED ORDER — PROPOFOL 10 MG/ML IV BOLUS
INTRAVENOUS | Status: DC | PRN
  Administered 2018-05-09: 160 mg via INTRAVENOUS

## 2018-05-09 MED ORDER — SODIUM CHLORIDE FLUSH 0.9 % IV SOLN
INTRAVENOUS | Status: AC
  Filled 2018-05-09: qty 10

## 2018-05-09 MED ORDER — KETOROLAC TROMETHAMINE 30 MG/ML IJ SOLN: 30.0000 mg | Freq: Four times a day (QID) | INTRAMUSCULAR | Status: DC

## 2018-05-09 MED ORDER — LACTATED RINGERS IV SOLN: INTRAVENOUS | Status: DC

## 2018-05-09 MED ORDER — LACTATED RINGERS IV SOLN
INTRAVENOUS | Status: DC
  Administered 2018-05-09: 15:00:00 via INTRAVENOUS

## 2018-05-09 MED ORDER — ROCURONIUM BROMIDE 50 MG/5ML IV SOLN
INTRAVENOUS | Status: AC
  Filled 2018-05-09: qty 1

## 2018-05-09 MED ORDER — PROMETHAZINE HCL 25 MG/ML IJ SOLN: 6.2500 mg | INTRAMUSCULAR | Status: DC | PRN

## 2018-05-09 MED ORDER — HYDROCODONE-ACETAMINOPHEN 5-325 MG PO TABS: 1.0000 | ORAL_TABLET | Freq: Four times a day (QID) | ORAL | Status: DC | PRN

## 2018-05-09 MED ORDER — DEXAMETHASONE SODIUM PHOSPHATE 10 MG/ML IJ SOLN
INTRAMUSCULAR | Status: AC
  Filled 2018-05-09: qty 1

## 2018-05-09 MED ORDER — LACTATED RINGERS IV SOLN
INTRAVENOUS | Status: DC | PRN
  Administered 2018-05-09: 07:00:00 via INTRAVENOUS

## 2018-05-09 MED ORDER — ONDANSETRON HCL 4 MG/2ML IJ SOLN
4.0000 mg | Freq: Four times a day (QID) | INTRAMUSCULAR | Status: DC | PRN
  Administered 2018-05-09: 4 mg via INTRAVENOUS
  Filled 2018-05-09: qty 2

## 2018-05-09 MED ORDER — IBUPROFEN 800 MG PO TABS
800.0000 mg | ORAL_TABLET | Freq: Three times a day (TID) | ORAL | Status: DC
  Administered 2018-05-10: 800 mg via ORAL
  Filled 2018-05-09: qty 1

## 2018-05-09 MED ORDER — MORPHINE SULFATE (PF) 2 MG/ML IV SOLN
1.0000 mg | INTRAVENOUS | Status: DC | PRN
  Administered 2018-05-09 (×2): 2 mg via INTRAVENOUS
  Filled 2018-05-09 (×2): qty 1

## 2018-05-09 MED ORDER — ROCURONIUM BROMIDE 100 MG/10ML IV SOLN
INTRAVENOUS | Status: DC | PRN
  Administered 2018-05-09: 10 mg via INTRAVENOUS
  Administered 2018-05-09: 50 mg via INTRAVENOUS
  Administered 2018-05-09: 10 mg via INTRAVENOUS

## 2018-05-09 MED ORDER — ACETAMINOPHEN 325 MG PO TABS: 325.0000 mg | ORAL_TABLET | ORAL | Status: DC | PRN

## 2018-05-09 MED ORDER — LIDOCAINE HCL (CARDIAC) PF 100 MG/5ML IV SOSY
PREFILLED_SYRINGE | INTRAVENOUS | Status: DC | PRN
  Administered 2018-05-09: 100 mg via INTRAVENOUS

## 2018-05-09 MED ORDER — GLYCOPYRROLATE 0.2 MG/ML IJ SOLN
INTRAMUSCULAR | Status: AC
  Filled 2018-05-09: qty 1

## 2018-05-09 MED ORDER — MEPERIDINE HCL 50 MG/ML IJ SOLN: 6.2500 mg | INTRAMUSCULAR | Status: DC | PRN

## 2018-05-09 MED ORDER — FENTANYL CITRATE (PF) 250 MCG/5ML IJ SOLN
INTRAMUSCULAR | Status: AC
  Filled 2018-05-09: qty 5

## 2018-05-09 MED ORDER — SUGAMMADEX SODIUM 200 MG/2ML IV SOLN
INTRAVENOUS | Status: DC | PRN
  Administered 2018-05-09: 225.2 mg via INTRAVENOUS

## 2018-05-09 MED ORDER — DOCUSATE SODIUM 100 MG PO CAPS
100.0000 mg | ORAL_CAPSULE | Freq: Two times a day (BID) | ORAL | Status: DC
  Administered 2018-05-09: 100 mg via ORAL
  Filled 2018-05-09 (×2): qty 1

## 2018-05-09 MED ORDER — DEXAMETHASONE SODIUM PHOSPHATE 10 MG/ML IJ SOLN
INTRAMUSCULAR | Status: DC | PRN
  Administered 2018-05-09: 8 mg via INTRAVENOUS

## 2018-05-09 MED ORDER — CEFAZOLIN SODIUM-DEXTROSE 2-4 GM/100ML-% IV SOLN
INTRAVENOUS | Status: AC
  Filled 2018-05-09: qty 100

## 2018-05-09 MED ORDER — MIDAZOLAM HCL 2 MG/2ML IJ SOLN
INTRAMUSCULAR | Status: DC | PRN
  Administered 2018-05-09: 2 mg via INTRAVENOUS

## 2018-05-09 MED ORDER — ONDANSETRON HCL 4 MG/2ML IJ SOLN
INTRAMUSCULAR | Status: DC | PRN
  Administered 2018-05-09: 4 mg via INTRAVENOUS

## 2018-05-09 SURGICAL SUPPLY — 54 items
BAG URINE DRAINAGE (UROLOGICAL SUPPLIES) ×3 IMPLANT
BLADE SURG SZ11 CARB STEEL (BLADE) ×3 IMPLANT
CANISTER SUCT 1200ML W/VALVE (MISCELLANEOUS) ×3 IMPLANT
CATH FOLEY 2WAY  5CC 16FR (CATHETERS) ×1
CATH URTH 16FR FL 2W BLN LF (CATHETERS) ×2 IMPLANT
CHLORAPREP W/TINT 26ML (MISCELLANEOUS) ×3 IMPLANT
CORD MONOPOLAR M/FML 12FT (MISCELLANEOUS) ×3 IMPLANT
DERMABOND ADVANCED (GAUZE/BANDAGES/DRESSINGS) ×1
DERMABOND ADVANCED .7 DNX12 (GAUZE/BANDAGES/DRESSINGS) ×2 IMPLANT
DRAPE PERI LITHO V/GYN (MISCELLANEOUS) ×3 IMPLANT
DRAPE SHEET LG 3/4 BI-LAMINATE (DRAPES) ×3 IMPLANT
DRAPE UNDER BUTTOCK W/FLU (DRAPES) ×3 IMPLANT
DRSG TEGADERM 2-3/8X2-3/4 SM (GAUZE/BANDAGES/DRESSINGS) ×9 IMPLANT
ELECT REM PT RETURN 9FT ADLT (ELECTROSURGICAL) ×3
ELECTRODE REM PT RTRN 9FT ADLT (ELECTROSURGICAL) ×2 IMPLANT
GAUZE PACK 2X3YD (MISCELLANEOUS) ×3 IMPLANT
GAUZE PETRO XEROFOAM 1X8 (MISCELLANEOUS) ×3 IMPLANT
GLOVE BIO SURGEON STRL SZ 6.5 (GLOVE) ×9 IMPLANT
GLOVE BIO SURGEON STRL SZ8 (GLOVE) ×15 IMPLANT
GLOVE INDICATOR 7.0 STRL GRN (GLOVE) ×3 IMPLANT
GLOVE INDICATOR 8.0 STRL GRN (GLOVE) ×3 IMPLANT
GOWN STRL REUS W/ TWL LRG LVL3 (GOWN DISPOSABLE) ×8 IMPLANT
GOWN STRL REUS W/ TWL XL LVL3 (GOWN DISPOSABLE) ×4 IMPLANT
GOWN STRL REUS W/TWL LRG LVL3 (GOWN DISPOSABLE) ×4
GOWN STRL REUS W/TWL XL LVL3 (GOWN DISPOSABLE) ×2
IRRIGATION STRYKERFLOW (MISCELLANEOUS) IMPLANT
IRRIGATOR STRYKERFLOW (MISCELLANEOUS)
IV LACTATED RINGERS 1000ML (IV SOLUTION) IMPLANT
KIT PINK PAD W/HEAD ARE REST (MISCELLANEOUS) ×3
KIT PINK PAD W/HEAD ARM REST (MISCELLANEOUS) ×2 IMPLANT
KIT TURNOVER CYSTO (KITS) ×3 IMPLANT
LABEL OR SOLS (LABEL) ×3 IMPLANT
NS IRRIG 500ML POUR BTL (IV SOLUTION) ×3 IMPLANT
PACK BASIN MINOR ARMC (MISCELLANEOUS) ×3 IMPLANT
PACK GYN LAPAROSCOPIC (MISCELLANEOUS) ×3 IMPLANT
PAD OB MATERNITY 4.3X12.25 (PERSONAL CARE ITEMS) ×3 IMPLANT
PAD PREP 24X41 OB/GYN DISP (PERSONAL CARE ITEMS) ×3 IMPLANT
PENCIL ELECTRO HAND CTR (MISCELLANEOUS) ×3 IMPLANT
SCISSORS METZENBAUM CVD 33 (INSTRUMENTS) IMPLANT
SHEARS HARMONIC ACE PLUS 36CM (ENDOMECHANICALS) ×3 IMPLANT
SLEEVE ENDOPATH XCEL 5M (ENDOMECHANICALS) ×6 IMPLANT
SUT CHROMIC 2 0 CT 1 (SUTURE) ×21 IMPLANT
SUT MNCRL 4-0 (SUTURE) ×1
SUT MNCRL 4-0 27XMFL (SUTURE) ×2
SUT VIC AB 0 CT1 27 (SUTURE) ×1
SUT VIC AB 0 CT1 27XCR 8 STRN (SUTURE) ×2 IMPLANT
SUT VIC AB 0 CT1 36 (SUTURE) ×12 IMPLANT
SUT VIC AB 0 CT2 27 (SUTURE) ×6 IMPLANT
SUT VIC AB 2-0 UR6 27 (SUTURE) ×6 IMPLANT
SUTURE MNCRL 4-0 27XMF (SUTURE) ×2 IMPLANT
SYR 10ML LL (SYRINGE) ×3 IMPLANT
TAPE TRANSPORE STRL 2 31045 (GAUZE/BANDAGES/DRESSINGS) ×3 IMPLANT
TROCAR XCEL NON-BLD 5MMX100MML (ENDOMECHANICALS) ×3 IMPLANT
TUBING INSUF HEATED (TUBING) ×3 IMPLANT

## 2018-05-09 NOTE — Transfer of Care (Signed)
Immediate Anesthesia Transfer of Care Note  Patient: Casey Colon  Procedure(s) Performed: LAPAROSCOPIC ASSISTED VAGINAL HYSTERECTOMY WITH BILATERAL  SALPINGECTOMY (Bilateral ) ANTERIOR REPAIR (CYSTOCELE) (N/A )  Patient Location: PACU  Anesthesia Type:General  Level of Consciousness: awake  Airway & Oxygen Therapy: Patient Spontanous Breathing  Post-op Assessment: Report given to RN  Post vital signs: Reviewed  Last Vitals:  Vitals Value Taken Time  BP 91/58 05/09/2018 10:02 AM  Temp    Pulse 82 05/09/2018 10:05 AM  Resp 23 05/09/2018 10:05 AM  SpO2 95 % 05/09/2018 10:05 AM  Vitals shown include unvalidated device data.  Last Pain:  Vitals:   05/09/18 0627  TempSrc: Tympanic  PainSc: 0-No pain         Complications: No apparent anesthesia complications

## 2018-05-09 NOTE — Op Note (Signed)
OPERATIVE NOTE:  Casey Colon PROCEDURE DATE: 05/09/2018   PREOPERATIVE DIAGNOSIS: 1.  Cystocele with incomplete uterovaginal prolapse  POSTOPERATIVE DIAGNOSIS: 1.  Cystocele with incomplete uterovaginal prolapse 2.  Uterine fibroids  PROCEDURE: LAVH bilateral salpingectomy SURGEON:  [name], MD ASSISTANTS: Jeannie Fend, MD ANESTHESIA: General INDICATIONS: 47 y.o. G1P0010 definitive surgery of uterine prolapse with traction cystocele.  FINDINGS: Multi-fibroid uterus; uterine prolapse with traction cystocele.  Cystocele resolved following hysterectomy   I/O's: Total I/O In: 300 [I.V.:300] Out: 250 [Blood:250] COUNTS:  YES SPECIMENS: Uterus with cervix and bilateral fallopian tubes ANTIBIOTIC PROPHYLAXIS:Ancef 2 grams COMPLICATIONS: None immediate  PROCEDURE IN DETAIL: Patient was brought snapping room placed in the supine position.  General endotracheal anesthesia was induced without difficulty.  She was placed in the dorsolithotomy position using the bumblebee stirrups.  A ChloraPrep and Hibiclens abdominal perineal and intravaginal prep and drape was performed in standard fashion. Timeout was completed. Foley catheter was placed into the bladder and was draining clear yellow urine.  Hulka tenaculum was placed onto the cervix to facilitate uterine manipulation. Subumbilical vertical incision was made 5 mm in length.  The Optiview laparoscopic trocar system was used to place a 5 mm port directly into the abdominal pelvic cavity without evidence of bowel or vascular injury.  2 other 5 mm ports were placed in the right and left lower quadrants respectively. Discectomy was then performed in standard fashion.  The mesosalpinx was sequentially grasped grasped coagulated and cut to the level of the uterine cornu.  The round ligament was grasped coagulated and cut.  The utero-ovarian ligaments were grasped coagulated and cut using the Ace harmonic scalpel.  This was done  bilaterally.  The broad ligament was dissected using the Ace harmonic scalpel.  Anterior bladder flap was created through sharp and blunt dissection.  The posterior leaf the broad ligament was likewise taken down with the harmonic scalpel.  The uterine arteries were then identified and cauterized using Kleppinger bipolar forceps.  The intervening area of the desiccation was then cut with the Ace harmonic scalpel.  This procedure was done bilaterally.  Once satisfied with mobilization of the uterus, the vaginal aspect of the procedure was then performed. The stirrups were placed in the high lithotomy position.  Weighted speculum was placed in the vagina.  Cervix was grasped with a double-tooth tenaculum.  Posterior colpotomy could not be made because of the location of the uterine fibroids in the cul-de-sac.  Therefore a anterior entry was performed.  The vagina was dissected off the lower uterine segment with the Bovie.  Sharp and blunt dissection were then used to mobilize the bladder off the lower uterine segment.  Eventually the anterior cul-de-sac was entered.  Sequentially the cardinal broad ligament complexes were then clamped with curved Heaney clamps followed by cutting Mayo scissors followed by suture ligation.  This was performed bilaterally.  The specimen is totally mobilized and removed from the operative field.  The posterior cuff was run using a running locking stitch of 0 Vicryl.  Peritoneum was closed using a pursestring stitch of 0 Vicryl.  Because the cystocele was reduced with removal of the uterus, decision was made to not perform the anterior colporrhaphy. Vaginal mucosa was closed with simple sutures of 2-0 chromic. Upon completion of the vaginal aspect of the procedure, repeat laparoscopy was performed and verified  excellent hemostasis at all pedicles.  Pneumoperitoneum was released.  The incisions were closed with 4-0 sutures-Vicryl.  Dermabond glue was placed over the  incisions.  Patient  was then awakened extubated taken satisfactory condition.  Casey Chubbuck A. Zipporah Plants, MD, [facility]

## 2018-05-09 NOTE — Interval H&P Note (Signed)
History and Physical Interval Note:  05/09/2018 7:25 AM  Casey Colon  has presented today for surgery, with the diagnosis of CYSTOCELE,RECTOCELE, CLASS 2 SEVERE OBESITY, IRRITIBLE BOWEL SYNDROME, CLIMACTERIC  The various methods of treatment have been discussed with the patient and family. After consideration of risks, benefits and other options for treatment, the patient has consented to  Procedure(s): LAPAROSCOPIC ASSISTED VAGINAL HYSTERECTOMY WITH BILATERAL  SALPINGECTOMY (Bilateral) ANTERIOR REPAIR (CYSTOCELE) (N/A) as a surgical intervention .  The patient's history has been reviewed, patient examined, no change in status, stable for surgery.  I have reviewed the patient's chart and labs.  Questions were answered to the patient's satisfaction.     Hassell Done A Defrancesco

## 2018-05-09 NOTE — Addendum Note (Signed)
Addendum  created 05/09/18 1537 by Doreen Salvage, CRNA   Charge Capture section accepted

## 2018-05-09 NOTE — Anesthesia Procedure Notes (Addendum)
Procedure Name: Intubation Performed by: Flonnie Wierman, CRNA Pre-anesthesia Checklist: Patient identified, Patient being monitored, Timeout performed, Emergency Drugs available and Suction available Patient Re-evaluated:Patient Re-evaluated prior to induction Oxygen Delivery Method: Circle system utilized Preoxygenation: Pre-oxygenation with 100% oxygen Induction Type: IV induction Ventilation: Mask ventilation without difficulty Laryngoscope Size: Mac and 3 Grade View: Grade II Tube type: Oral Tube size: 7.0 mm Number of attempts: 1 Airway Equipment and Method: Stylet Placement Confirmation: ETT inserted through vocal cords under direct vision,  positive ETCO2 and breath sounds checked- equal and bilateral Secured at: 20 cm Tube secured with: Tape Dental Injury: Teeth and Oropharynx as per pre-operative assessment        

## 2018-05-09 NOTE — Anesthesia Postprocedure Evaluation (Signed)
Anesthesia Post Note  Patient: Casey Colon  Procedure(s) Performed: LAPAROSCOPIC ASSISTED VAGINAL HYSTERECTOMY WITH BILATERAL  SALPINGECTOMY (Bilateral )  Patient location during evaluation: PACU Anesthesia Type: General Level of consciousness: awake and alert Pain management: pain level controlled Vital Signs Assessment: post-procedure vital signs reviewed and stable Respiratory status: spontaneous breathing, nonlabored ventilation and respiratory function stable Cardiovascular status: blood pressure returned to baseline and stable Postop Assessment: no apparent nausea or vomiting Anesthetic complications: no     Last Vitals:  Vitals:   05/09/18 1207 05/09/18 1308  BP: (!) 102/47 111/69  Pulse: 67 68  Resp: 18 18  Temp: 36.4 C 36.7 C  SpO2: 100% 97%    Last Pain:  Vitals:   05/09/18 1308  TempSrc: Oral  PainSc:                  Casey Colon

## 2018-05-09 NOTE — Anesthesia Post-op Follow-up Note (Signed)
Anesthesia QCDR form completed.        

## 2018-05-10 DIAGNOSIS — N814 Uterovaginal prolapse, unspecified: Secondary | ICD-10-CM | POA: Diagnosis not present

## 2018-05-10 LAB — SURGICAL PATHOLOGY

## 2018-05-10 LAB — RPR: RPR Ser Ql: NONREACTIVE

## 2018-05-10 LAB — HEMOGLOBIN: Hemoglobin: 12.2 g/dL (ref 12.0–16.0)

## 2018-05-10 MED ORDER — ONDANSETRON 4 MG PO TBDP: 4.0000 mg | ORAL_TABLET | Freq: Four times a day (QID) | ORAL | 0 refills | Status: DC | PRN

## 2018-05-10 MED ORDER — IBUPROFEN 800 MG PO TABS: 800.0000 mg | ORAL_TABLET | Freq: Three times a day (TID) | ORAL | 0 refills | Status: DC

## 2018-05-10 MED ORDER — OXYCODONE-ACETAMINOPHEN 5-325 MG PO TABS: 1.0000 | ORAL_TABLET | Freq: Four times a day (QID) | ORAL | 0 refills | Status: DC | PRN

## 2018-05-10 NOTE — Discharge Summary (Signed)
Physician Discharge Summary  Patient ID: Casey Colon MRN: [id number] DOB/AGE: June 20, 1971 47 y.o.  Admit date: 05/09/2018 Discharge date: 05/10/2018  Admission Diagnoses: Pelvic Organ Prolapse (Cystocele; Uterine Prolapse)  Discharge Diagnoses:  Fibroids and Pelvic Organ Prolapse (Cystocele, traction, resolved; Uterine Prolapse)  Operative Procedures: Procedure(s): LAPAROSCOPIC ASSISTED VAGINAL HYSTERECTOMY WITH BILATERAL  SALPINGECTOMY (Bilateral)  Hospital Course: Uncomplicated .   Significant Diagnostic Studies:  Lab Results  Component Value Date   HGB 12.2 05/10/2018   HGB 14.1 05/09/2018   HGB 14.6 05/05/2018   Lab Results  Component Value Date   HCT 41.6 05/09/2018   HCT 43.8 05/05/2018   CBC Latest Ref Rng & Units 05/10/2018 05/09/2018 05/05/2018  WBC 3.6 - 11.0 K/uL - 8.8 8.4  Hemoglobin 12.0 - 16.0 g/dL 12.2 14.1 14.6  Hematocrit 35.0 - 47.0 % - 41.6 43.8  Platelets 150 - 440 K/uL - 211 208     Discharged Condition: good  Discharge Exam: Blood pressure (!) 104/52, pulse 84, temperature 97.9 F (36.6 C), temperature source Oral, resp. rate 18, height 5\' 6"  (1.676 m), weight 248 lb 4.8 oz (112.6 kg), last menstrual period 04/20/2018, SpO2 100 %. Incision/Wound: clean, dry and no drainage  Disposition: Discharge disposition: 01-Home or Self Care       Discharge Instructions    Discharge patient   Complete by:  As directed    Discharge disposition:  01-Home or Self Care   Discharge patient date:  05/10/2018     Allergies as of 05/10/2018      Reactions   Dilaudid [hydromorphone Hcl] Other (See Comments)   Chest heaviness/difficulty breathing   Zoloft [sertraline] Other (See Comments)   anger      Medication List    TAKE these medications   aspirin-acetaminophen-caffeine 250-250-65 MG tablet Commonly known as:  EXCEDRIN MIGRAINE Take 1 tablet by mouth every 6 (six) hours as needed for headache.   calcium carbonate 500 MG chewable tablet Commonly known  as:  TUMS - dosed in mg elemental calcium Chew 1-2 tablets by mouth 2 (two) times daily as needed (for heartburn/indigestion.).   fexofenadine 180 MG tablet Commonly known as:  ALLEGRA Take 180 mg by mouth daily as needed for allergies.   ibuprofen 800 MG tablet Commonly known as:  ADVIL,MOTRIN Take 1 tablet (800 mg total) by mouth 3 (three) times daily. What changed:    medication strength  how much to take  when to take this  reasons to take this   oxyCODONE-acetaminophen 5-325 MG tablet Commonly known as:  PERCOCET/ROXICET Take 1-2 tablets by mouth every 6 (six) hours as needed.   ranitidine 150 MG tablet Commonly known as:  ZANTAC TAKE 1 TABLET(150 MG) BY MOUTH TWICE DAILY   triamterene-hydrochlorothiazide 37.5-25 MG tablet Commonly known as:  MAXZIDE-25 Take 1 tablet by mouth daily.   Vitamin D3 2000 units Tabs Take 2,000 Units by mouth daily.      Follow-up Information    Senia Even, Alanda Slim, MD. Go in 3 week(s).   Specialties:  Obstetrics and Gynecology, Radiology Contact information: [address] [place] [address] [phone]           Signed: Alanda Slim Naol Ontiveros 05/10/2018, 8:07 AM

## 2018-05-10 NOTE — Plan of Care (Signed)
Vs stable; up ad lib; taking PO norco and motrin for pain control; tolerating regular diet

## 2018-05-10 NOTE — Discharge Instructions (Signed)
Call your doctor for increased pain or vaginal bleeding, temperature above 100.4, depression, or concerns.  No strenuous activity or heavy lifting for 6 weeks. No intercourse, tampons, douching, or enemas for 6 weeks.  No tub baths- showers only.  No driving for 2 weeks or while taking pain medications.

## 2018-05-10 NOTE — Progress Notes (Signed)
Discharge instructions provided.  Pt and sig other verbalize understanding of all instructions and follow-up care.  Pt discharged to home at 1035 on 05/10/18 via wheelchair by volunteer. Reed Breech, RN 05/10/2018 10:43 AM

## 2018-05-12 ENCOUNTER — Emergency Department
Admission: EM | Admit: 2018-05-12 | Discharge: 2018-05-12 | Disposition: A | Discharge status: Home / Self Care | Payer: BLUE CROSS/BLUE SHIELD | Attending: Emergency Medicine | Admitting: Emergency Medicine

## 2018-05-12 ENCOUNTER — Emergency Department: Payer: BLUE CROSS/BLUE SHIELD

## 2018-05-12 ENCOUNTER — Encounter: Payer: Self-pay | Admitting: Emergency Medicine

## 2018-05-12 ENCOUNTER — Other Ambulatory Visit: Payer: Self-pay

## 2018-05-12 DIAGNOSIS — M79604 Pain in right leg: Secondary | ICD-10-CM | POA: Insufficient documentation

## 2018-05-12 DIAGNOSIS — T148XXA Other injury of unspecified body region, initial encounter: Secondary | ICD-10-CM | POA: Insufficient documentation

## 2018-05-12 DIAGNOSIS — Y9389 Activity, other specified: Secondary | ICD-10-CM | POA: Insufficient documentation

## 2018-05-12 DIAGNOSIS — Y998 Other external cause status: Secondary | ICD-10-CM | POA: Insufficient documentation

## 2018-05-12 DIAGNOSIS — Y929 Unspecified place or not applicable: Secondary | ICD-10-CM | POA: Diagnosis not present

## 2018-05-12 DIAGNOSIS — X58XXXA Exposure to other specified factors, initial encounter: Secondary | ICD-10-CM | POA: Diagnosis not present

## 2018-05-12 MED ORDER — OXYCODONE-ACETAMINOPHEN 5-325 MG PO TABS
2.0000 | ORAL_TABLET | Freq: Once | ORAL | Status: AC
  Administered 2018-05-12: 2 via ORAL
  Filled 2018-05-12: qty 2

## 2018-05-12 MED ORDER — DIAZEPAM 5 MG PO TABS: 5.0000 mg | ORAL_TABLET | Freq: Three times a day (TID) | ORAL | 0 refills | Status: DC | PRN

## 2018-05-12 NOTE — ED Provider Notes (Signed)
[facility] Emergency Department Provider Note       Time seen: ----------------------------------------- 7:10 AM on 05/12/2018 -----------------------------------------   I have reviewed the triage vital signs and the nursing notes.  HISTORY   Chief Complaint Leg Pain    HPI Casey Colon is a 47 y.o. female with a history of chronic headaches, edema, GERD and IBS who presents to the ED for right leg pain.  Patient reports she had a laparoscopic hysterectomy on Monday and now has pain diffusely from the right groin into her thigh.  She thought it may be muscular but she wanted to be checked out just in case it was a blood clot.  She denies fevers, chills, chest pain, shortness of breath, vomiting or diarrhea.  Past Medical History:  Diagnosis Date  . Chronic headaches   . Edema   . GERD (gastroesophageal reflux disease)   . IBS (irritable bowel syndrome)     Patient Active Problem List   Diagnosis Date Noted  . S/P laparoscopic assisted vaginal hysterectomy (LAVH) 05/09/2018  . Climacteric 04/06/2018  . Irregular menses 03/01/2018  . Chronic pain of right knee 03/01/2018  . Encounter for general adult medical examination with abnormal findings 02/26/2017  . GERD (gastroesophageal reflux disease) 01/06/2017  . Vitamin D deficiency 01/06/2017  . Obesity 01/06/2017  . IBS (irritable bowel syndrome) 01/06/2017  . Bilateral lower extremity edema 01/06/2017    Past Surgical History:  Procedure Laterality Date  . LAPAROSCOPIC VAGINAL HYSTERECTOMY WITH SALPINGECTOMY Bilateral 05/09/2018   Procedure: LAPAROSCOPIC ASSISTED VAGINAL HYSTERECTOMY WITH BILATERAL  SALPINGECTOMY;  Surgeon: [name], MD;  Location: [facility] ORS;  Service: Gynecology;  Laterality: Bilateral;  . NO PAST SURGERIES      Allergies Dilaudid [hydromorphone hcl] and Zoloft [sertraline]  Social History Social History   Tobacco Use  . Smoking status: Never Smoker  .  Smokeless tobacco: Never Used  Substance Use Topics  . Alcohol use: No  . Drug use: No   Review of Systems Constitutional: Negative for fever. Cardiovascular: Negative for chest pain. Respiratory: Negative for shortness of breath. Gastrointestinal: Negative for abdominal pain, vomiting and diarrhea. Musculoskeletal: Positive for right leg pain Skin: Negative for rash. Neurological: Negative for headaches, focal weakness or numbness.  All systems negative/normal/unremarkable except as stated in the HPI  ____________________________________________   PHYSICAL EXAM:  VITAL SIGNS: ED Triage Vitals  Enc Vitals Group     BP 05/12/18 0632 128/87     Pulse Rate 05/12/18 0632 84     Resp 05/12/18 0632 18     Temp 05/12/18 0632 98.2 F (36.8 C)     Temp Source 05/12/18 0632 Oral     SpO2 05/12/18 0632 100 %     Weight 05/12/18 0617 248 lb (112.5 kg)     Height 05/12/18 0617 5\' 6"  (1.676 m)     Head Circumference --      Peak Flow --      Pain Score 05/12/18 0617 7     Pain Loc --      Pain Edu? --      Excl. in [place]? --    Constitutional: Alert and oriented. Well appearing and in no distress. Eyes: Conjunctivae are normal. Normal extraocular movements. Cardiovascular: Normal rate, regular rhythm. No murmurs, rubs, or gallops. Respiratory: Normal respiratory effort without tachypnea nor retractions. Breath sounds are clear and equal bilaterally. No wheezes/rales/rhonchi. Gastrointestinal: Soft and nontender. Normal bowel sounds Musculoskeletal: Mild pain with range of motion of  the right leg, no obvious swelling or edema on the right leg compared to left Neurologic:  Normal speech and language. No gross focal neurologic deficits are appreciated.  Skin:  Skin is warm, dry and intact. No rash noted. Psychiatric: Mood and affect are normal. Speech and behavior are normal.  ___________________________________________  ED COURSE:  As part of my medical decision making, I reviewed  the following data within the [place] History obtained from family if available, nursing notes, old chart and ekg, as well as notes from prior ED visits. Patient presented for right leg pain, we will assess with labs and imaging as indicated at this time.   Procedures ____________________________________________   RADIOLOGY  Right lower extreme the ultrasound IMPRESSION: No evidence of deep venous thrombosis. ____________________________________________  DIFFERENTIAL DIAGNOSIS   Muscle strain, spasm, DVT  FINAL ASSESSMENT AND PLAN  Muscle strain   Plan: The patient had presented for right leg pain which is likely from positioning intraoperatively. Patient's imaging was negative for DVT.  She will be prescribed Valium as a muscle relaxant.  She is cleared for outpatient follow-up.   Laurence Aly, MD   Note: This note was generated in part or whole with voice recognition software. Voice recognition is usually quite accurate but there are transcription errors that can and very often do occur. I apologize for any typographical errors that were not detected and corrected.     Earleen Newport, MD 05/12/18 [id number]

## 2018-05-12 NOTE — ED Notes (Signed)
Patient transported to Ultrasound 

## 2018-05-12 NOTE — ED Notes (Signed)
Pt not in room at this time, pt in triage area

## 2018-05-12 NOTE — ED Triage Notes (Signed)
Patient ambulatory to triage, limping gait; pt reports lap hysterectomy on Monday; now with pain to right groin radiating down leg; denies any accomp symptoms

## 2018-05-12 NOTE — ED Notes (Signed)
Pt discharged home after verbalizing understanding of discharge instructions; nad noted. 

## 2018-05-31 ENCOUNTER — Encounter: Payer: Self-pay | Admitting: Obstetrics and Gynecology

## 2018-05-31 ENCOUNTER — Ambulatory Visit (INDEPENDENT_AMBULATORY_CARE_PROVIDER_SITE_OTHER): Payer: BLUE CROSS/BLUE SHIELD | Admitting: Obstetrics and Gynecology

## 2018-05-31 VITALS — BP 115/77 | HR 90 | Ht 66.0 in | Wt 249.8 lb

## 2018-05-31 DIAGNOSIS — Z90710 Acquired absence of both cervix and uterus: Secondary | ICD-10-CM

## 2018-05-31 NOTE — Progress Notes (Signed)
Chief complaint: 1.  Postop check 2.  Status post LAVH bilateral salpingectomy  Pathology from surgery: DIAGNOSIS:  A. UTERUS WITH CERVIX AND BILATERAL FALLOPIAN TUBES; HYSTERECTOMY WITH  BILATERAL SALPINGECTOMY:  - NABOTHIAN CYSTS OF THE ENDOCERVIX.  - SECRETORY ENDOMETRIUM.  - LEIOMYOMATA, UP TO 2.5 CM.  - BILATERAL FALLOPIAN TUBES WITHOUT PATHOLOGIC CHANGE.   Patient reports normal bowel and bladder function.  She is not having any significant abdominal pelvic pain.  Appetite is normal.  She has experienced minimal vaginal discharge which has resolved this week.  She would like to return to work doing 8-hour days until her final postop check. Lekeshia is experiencing occasional vasomotor symptoms which have not changed since prior to surgery. Tanith did go to the emergency room for right leg pain week after surgery; diagnosis was muscle strain likely from positioning during surgery.  Symptoms have resolved.  OBJECTIVE: BP 115/77   Pulse 90   Ht 5\' 6"  (1.676 m)   Wt 249 lb 12.8 oz (113.3 kg)   LMP 04/20/2018 (Exact Date)   BMI 40.32 kg/m  Pleasant female no acute distress.  Alert and oriented. Abdomen: Soft, nontender; laparoscopy incisions are well approximated; suture nidus in subumbilical incision is persisting. Extremities: Nontender; no edema  ASSESSMENT: 1.  Normal postop check 2.  Status post LAVH bilateral salpingectomy 3 weeks ago  PLAN: 1.  Continue with routine postop precautions 2.  Return to work doing 8-hour days (not 12-hour shifts as previously performed) 3.  Return in 3 weeks for final postop check  [name], MD  Note: This dictation was prepared with Dragon dictation along with smaller phrase technology. Any transcriptional errors that result from this process are unintentional.

## 2018-05-31 NOTE — Patient Instructions (Signed)
1.  Continue with postop precautions 2.  Return in 3 weeks for final postop check 3.  May resume working 8-hour days until final postop check

## 2018-06-02 ENCOUNTER — Other Ambulatory Visit: Payer: BLUE CROSS/BLUE SHIELD

## 2018-06-08 ENCOUNTER — Other Ambulatory Visit (INDEPENDENT_AMBULATORY_CARE_PROVIDER_SITE_OTHER): Payer: BLUE CROSS/BLUE SHIELD

## 2018-06-08 DIAGNOSIS — E559 Vitamin D deficiency, unspecified: Secondary | ICD-10-CM | POA: Diagnosis not present

## 2018-06-08 LAB — VITAMIN D 25 HYDROXY (VIT D DEFICIENCY, FRACTURES): VITD: 27.35 ng/mL — ABNORMAL LOW (ref 30.00–100.00)

## 2018-06-21 ENCOUNTER — Encounter: Payer: Self-pay | Admitting: Obstetrics and Gynecology

## 2018-06-21 ENCOUNTER — Ambulatory Visit (INDEPENDENT_AMBULATORY_CARE_PROVIDER_SITE_OTHER): Payer: BLUE CROSS/BLUE SHIELD | Admitting: Obstetrics and Gynecology

## 2018-06-21 VITALS — BP 122/80 | HR 85 | Ht 66.0 in | Wt 250.5 lb

## 2018-06-21 DIAGNOSIS — Z09 Encounter for follow-up examination after completed treatment for conditions other than malignant neoplasm: Secondary | ICD-10-CM

## 2018-06-21 DIAGNOSIS — Z90710 Acquired absence of both cervix and uterus: Secondary | ICD-10-CM

## 2018-06-21 NOTE — Progress Notes (Signed)
Chief complaint: 1.  Final postop check 2.  Status post LAVH bilateral salpingectomy 05/09/2018 3.  History of uterine fibroids with traction cystocele  Pathology from surgery previously reviewed.  Pathology notable for leiomyoma.  Bowel bladder function reportedly normal.  She is not having any significant pelvic pain.  OBJECTIVE: BP 122/80   Pulse 85   Ht 5\' 6"  (1.676 m)   Wt 250 lb 8 oz (113.6 kg)   LMP 04/20/2018 (Exact Date)   BMI 40.43 kg/m  Pleasant well-appearing female no acute distress.  Alert and oriented. Abdomen: Soft, nontender; laparoscopy incisions are well approximated. Pelvic: External genitalia-normal BUS-normal Vagina-good vault support; vaginal cuff intact with minimal residual suture present Bimanual-no palpable masses or tenderness; vaginal cuff is intact; cervix/uterus surgically absent Rectovaginal-normal external exam  ASSESSMENT: 1.  Final postop check-normal 2.  Status post LAVH bilateral salpingectomy 05/09/2018  PLAN: 1.  Resume all activities without restriction 2.  Maintain annual follow-up with Dr. Caryl Bis 3.  Follow-up in 1 year here or as needed.  [name], MD  Note: This dictation was prepared with Dragon dictation along with smaller phrase technology. Any transcriptional errors that result from this process are unintentional.

## 2018-06-21 NOTE — Patient Instructions (Addendum)
1.  Resume all activities without restriction 2.  Return in 1 year for follow-up or as needed 3.  Continue with annual physicals with Dr. Caryl Bis

## 2018-07-24 ENCOUNTER — Other Ambulatory Visit: Payer: Self-pay | Admitting: Family Medicine

## 2018-09-12 ENCOUNTER — Telehealth: Payer: Self-pay | Admitting: Radiology

## 2018-09-12 DIAGNOSIS — E559 Vitamin D deficiency, unspecified: Secondary | ICD-10-CM

## 2018-09-12 NOTE — Telephone Encounter (Signed)
Pt coming tmrw for labs. Please place future orders. Thank you.

## 2018-09-13 ENCOUNTER — Other Ambulatory Visit (INDEPENDENT_AMBULATORY_CARE_PROVIDER_SITE_OTHER): Payer: BLUE CROSS/BLUE SHIELD

## 2018-09-13 DIAGNOSIS — E559 Vitamin D deficiency, unspecified: Secondary | ICD-10-CM | POA: Diagnosis not present

## 2018-09-13 LAB — VITAMIN D 25 HYDROXY (VIT D DEFICIENCY, FRACTURES): VITD: 30.61 ng/mL (ref 30.00–100.00)

## 2018-09-14 ENCOUNTER — Other Ambulatory Visit: Payer: BLUE CROSS/BLUE SHIELD

## 2018-10-29 ENCOUNTER — Other Ambulatory Visit: Payer: Self-pay | Admitting: Family Medicine

## 2018-12-08 NOTE — Op Note (Signed)
ADDENDUM: Due to the complex nature of the procedure, and the lack of availability of a qualified first assistant, Dr. Jeannie Fend assisted in this hysterectomy case which helped restore normal anatomy, with resolution of the cystocele following removal of the uterus.  [name], MD'

## 2019-02-04 ENCOUNTER — Other Ambulatory Visit: Payer: Self-pay | Admitting: Family Medicine

## 2019-04-17 ENCOUNTER — Telehealth: Payer: Self-pay | Admitting: *Deleted

## 2019-04-17 NOTE — Telephone Encounter (Signed)
Copied from [place] [id number]. Topic: Referral - Request for Referral >> Apr 17, 2019 11:32 AM Virl Axe D wrote: Has patient seen PCP for this complaint? No *If NO, is insurance requiring patient see PCP for this issue before PCP can refer them? Referral for which specialty: Back specialist Preferred provider/office: In network Reason for referral: Back pain/protruding disk

## 2019-04-17 NOTE — Telephone Encounter (Signed)
I have not seen the patient in over a year.  She needs an evaluation to determine if in a referral is appropriate

## 2019-04-17 NOTE — Telephone Encounter (Signed)
Sent to PCP to place referral if needed

## 2019-04-18 NOTE — Telephone Encounter (Signed)
Called Pt and told her she needs to be seen first before getting a referral. Scheduled Pt a Office visit for 05/03/2019 @ 11:00am with Dr. Caryl Bis.

## 2019-05-03 ENCOUNTER — Encounter: Payer: Self-pay | Admitting: Family Medicine

## 2019-05-03 ENCOUNTER — Telehealth: Payer: Self-pay | Admitting: Family Medicine

## 2019-05-03 ENCOUNTER — Ambulatory Visit (INDEPENDENT_AMBULATORY_CARE_PROVIDER_SITE_OTHER): Payer: BLUE CROSS/BLUE SHIELD | Admitting: Family Medicine

## 2019-05-03 ENCOUNTER — Other Ambulatory Visit: Payer: Self-pay

## 2019-05-03 DIAGNOSIS — Z1329 Encounter for screening for other suspected endocrine disorder: Secondary | ICD-10-CM

## 2019-05-03 DIAGNOSIS — Z13220 Encounter for screening for lipoid disorders: Secondary | ICD-10-CM

## 2019-05-03 DIAGNOSIS — E6609 Other obesity due to excess calories: Secondary | ICD-10-CM

## 2019-05-03 DIAGNOSIS — M545 Low back pain: Secondary | ICD-10-CM

## 2019-05-03 DIAGNOSIS — E559 Vitamin D deficiency, unspecified: Secondary | ICD-10-CM

## 2019-05-03 DIAGNOSIS — G8929 Other chronic pain: Secondary | ICD-10-CM | POA: Insufficient documentation

## 2019-05-03 NOTE — Progress Notes (Signed)
Virtual Visit via video Note  This visit type was conducted due to national recommendations for restrictions regarding the COVID-19 pandemic (e.g. social distancing).  This format is felt to be most appropriate for this patient at this time.  All issues noted in this document were discussed and addressed.  No physical exam was performed (except for noted visual exam findings with Video Visits).   I connected with Casey Colon today at  8:00 AM EDT by a video enabled telemedicine application and verified that I am speaking with the correct person using two identifiers. Location patient: home Location provider: work Persons participating in the virtual visit: patient, provider  I discussed the limitations, risks, security and privacy concerns of performing an evaluation and management service by telephone and the availability of in person appointments. I also discussed with the patient that there may be a patient responsible charge related to this service. The patient expressed understanding and agreed to proceed.  Reason for visit: same day visit  HPI: Chronic low back pain: Patient notes this is been going on for years.  She had an MRI 7 to 8 years ago that revealed DDD of the lumbar spine.  She notes that has progressively worsened and she would like to see a specialist.  She notes no recent injury.  No radiation.  No numbness or weakness.  No incontinence she does use some Advil with this.  She wonders about a meloxicam prescription.   ROS: See pertinent positives and negatives per HPI.  Past Medical History:  Diagnosis Date  . Chronic headaches   . Edema   . GERD (gastroesophageal reflux disease)   . IBS (irritable bowel syndrome)     Past Surgical History:  Procedure Laterality Date  . LAPAROSCOPIC VAGINAL HYSTERECTOMY WITH SALPINGECTOMY Bilateral 05/09/2018   Procedure: LAPAROSCOPIC ASSISTED VAGINAL HYSTERECTOMY WITH BILATERAL  SALPINGECTOMY;  Surgeon: [name], MD;   Location: [facility] ORS;  Service: Gynecology;  Laterality: Bilateral;  . NO PAST SURGERIES      Family History  Problem Relation Age of Onset  . Asthma Maternal Grandmother   . Ovarian cancer Neg Hx   . Colon cancer Neg Hx     SOCIAL HX: Non-smoker.   Current Outpatient Medications:  .  aspirin-acetaminophen-caffeine (EXCEDRIN MIGRAINE) 250-250-65 MG tablet, Take 1 tablet by mouth every 6 (six) hours as needed for headache., Disp: , Rfl:  .  calcium carbonate (TUMS - DOSED IN MG ELEMENTAL CALCIUM) 500 MG chewable tablet, Chew 1-2 tablets by mouth 2 (two) times daily as needed (for heartburn/indigestion.). , Disp: , Rfl:  .  Cholecalciferol (VITAMIN D3) 2000 units TABS, Take 2,000 Units by mouth daily., Disp: , Rfl:  .  famotidine (PEPCID) 20 MG tablet, Take 20 mg by mouth 2 (two) times daily., Disp: , Rfl:  .  fexofenadine (ALLEGRA) 180 MG tablet, Take 180 mg by mouth daily as needed for allergies., Disp: , Rfl:  .  ibuprofen (ADVIL,MOTRIN) 800 MG tablet, Take 1 tablet (800 mg total) by mouth 3 (three) times daily., Disp: 30 tablet, Rfl: 0 .  triamterene-hydrochlorothiazide (MAXZIDE-25) 37.5-25 MG tablet, TAKE 1 TABLET BY MOUTH DAILY, Disp: 90 tablet, Rfl: 0 .  ondansetron (ZOFRAN ODT) 4 MG disintegrating tablet, Take 1 tablet (4 mg total) by mouth every 6 (six) hours as needed for nausea. (Patient not taking: Reported on 05/03/2019), Disp: 20 tablet, Rfl: 0 .  ranitidine (ZANTAC) 150 MG tablet, TAKE 1 TABLET(150 MG) BY MOUTH TWICE DAILY (Patient not taking: Reported  on 05/03/2019), Disp: 180 tablet, Rfl: 1  EXAM:  VITALS per patient if applicable: None.  GENERAL: alert, oriented, appears well and in no acute distress  HEENT: atraumatic, conjunttiva clear, no obvious abnormalities on inspection of external nose and ears  NECK: normal movements of the head and neck  LUNGS: on inspection no signs of respiratory distress, breathing rate appears normal, no obvious gross SOB, gasping or  wheezing  CV: no obvious cyanosis  MS: moves all visible extremities without noticeable abnormality  PSYCH/NEURO: pleasant and cooperative, no obvious depression or anxiety, speech and thought processing grossly intact  ASSESSMENT AND PLAN:  Discussed the following assessment and plan:  Chronic right-sided low back pain without sciatica - Plan: DG Lumbar Spine Complete  Lipid screening - Plan: Lipid panel, Comp Met (CMET)  Thyroid disorder screen - Plan: TSH  Obesity due to excess calories, unspecified classification, unspecified whether serious comorbidity present - Plan: Hemoglobin A1c  Vitamin D deficiency - Plan: Vitamin D (25 hydroxy)  Chronic right-sided low back pain without sciatica New issue to me.  Will obtain an x-ray.  Consider Mobic after labs return.  Consider referral to orthopedics once x-ray returns.  Given return precautions.  Lab work for upcoming physical will be obtained.  This will be scheduled by CMA.   I discussed the assessment and treatment plan with the patient. The patient was provided an opportunity to ask questions and all were answered. The patient agreed with the plan and demonstrated an understanding of the instructions.   The patient was advised to call back or seek an in-person evaluation if the symptoms worsen or if the condition fails to improve as anticipated.    Tommi Rumps, MD

## 2019-05-03 NOTE — Telephone Encounter (Signed)
Please contact the patient and get her scheduled for lab work and an x-ray sometime in the next week or so.

## 2019-05-03 NOTE — Telephone Encounter (Signed)
Can you call the patient and get her set up for labs and an x-ray this week?

## 2019-05-03 NOTE — Assessment & Plan Note (Signed)
New issue to me.  Will obtain an x-ray.  Consider Mobic after labs return.  Consider referral to orthopedics once x-ray returns.  Given return precautions.

## 2019-05-03 NOTE — Telephone Encounter (Signed)
Pt coming on Friday @ 2:30

## 2019-05-05 ENCOUNTER — Other Ambulatory Visit: Payer: Self-pay

## 2019-05-05 ENCOUNTER — Other Ambulatory Visit (INDEPENDENT_AMBULATORY_CARE_PROVIDER_SITE_OTHER): Payer: BLUE CROSS/BLUE SHIELD

## 2019-05-05 ENCOUNTER — Ambulatory Visit (INDEPENDENT_AMBULATORY_CARE_PROVIDER_SITE_OTHER): Payer: BLUE CROSS/BLUE SHIELD

## 2019-05-05 DIAGNOSIS — Z1329 Encounter for screening for other suspected endocrine disorder: Secondary | ICD-10-CM

## 2019-05-05 DIAGNOSIS — E6609 Other obesity due to excess calories: Secondary | ICD-10-CM

## 2019-05-05 DIAGNOSIS — E559 Vitamin D deficiency, unspecified: Secondary | ICD-10-CM | POA: Diagnosis not present

## 2019-05-05 DIAGNOSIS — Z13220 Encounter for screening for lipoid disorders: Secondary | ICD-10-CM

## 2019-05-05 DIAGNOSIS — G8929 Other chronic pain: Secondary | ICD-10-CM

## 2019-05-05 DIAGNOSIS — M545 Low back pain: Secondary | ICD-10-CM | POA: Diagnosis not present

## 2019-05-05 LAB — COMPREHENSIVE METABOLIC PANEL
ALT: 14 U/L (ref 0–35)
AST: 13 U/L (ref 0–37)
Albumin: 3.8 g/dL (ref 3.5–5.2)
Alkaline Phosphatase: 72 U/L (ref 39–117)
BUN: 13 mg/dL (ref 6–23)
CO2: 27 mEq/L (ref 19–32)
Calcium: 9.1 mg/dL (ref 8.4–10.5)
Chloride: 104 mEq/L (ref 96–112)
Creatinine, Ser: 0.82 mg/dL (ref 0.40–1.20)
GFR: 75.02 mL/min (ref >60.00)
Glucose, Bld: 81 mg/dL (ref 70–99)
Potassium: 3.8 mEq/L (ref 3.5–5.1)
Sodium: 139 mEq/L (ref 135–145)
Total Bilirubin: 1.1 mg/dL (ref 0.2–1.2)
Total Protein: 7.1 g/dL (ref 6.0–8.3)

## 2019-05-05 LAB — VITAMIN D 25 HYDROXY (VIT D DEFICIENCY, FRACTURES): VITD: 26.03 ng/mL — ABNORMAL LOW (ref 30.00–100.00)

## 2019-05-05 LAB — LIPID PANEL
Cholesterol: 198 mg/dL (ref 0–200)
HDL: 46 mg/dL (ref >39.00)
LDL Cholesterol: 117 mg/dL — ABNORMAL HIGH (ref 0–99)
NonHDL: 152.12
Total CHOL/HDL Ratio: 4
Triglycerides: 175 mg/dL — ABNORMAL HIGH (ref 0.0–149.0)
VLDL: 35 mg/dL (ref 0.0–40.0)

## 2019-05-05 LAB — TSH: TSH: 1.85 u[IU]/mL (ref 0.35–4.50)

## 2019-05-05 LAB — HEMOGLOBIN A1C: Hgb A1c MFr Bld: 5.7 % (ref 4.6–6.5)

## 2019-05-05 NOTE — Telephone Encounter (Signed)
Pt coming in today for labs & x-ray

## 2019-05-08 ENCOUNTER — Encounter: Payer: Self-pay | Admitting: *Deleted

## 2019-05-08 DIAGNOSIS — G8929 Other chronic pain: Secondary | ICD-10-CM

## 2019-05-08 DIAGNOSIS — M545 Low back pain, unspecified: Secondary | ICD-10-CM

## 2019-05-09 MED ORDER — MELOXICAM 7.5 MG PO TABS: 7.5000 mg | ORAL_TABLET | Freq: Every day | ORAL | 0 refills | Status: DC

## 2019-05-10 ENCOUNTER — Other Ambulatory Visit: Payer: Self-pay | Admitting: Family Medicine

## 2019-06-06 ENCOUNTER — Other Ambulatory Visit: Payer: Self-pay | Admitting: Family Medicine

## 2019-06-22 ENCOUNTER — Encounter: Payer: BLUE CROSS/BLUE SHIELD | Admitting: Obstetrics and Gynecology

## 2019-07-05 LAB — HM MAMMOGRAPHY

## 2019-07-10 ENCOUNTER — Encounter: Payer: BLUE CROSS/BLUE SHIELD | Admitting: Family Medicine

## 2019-07-30 ENCOUNTER — Other Ambulatory Visit: Payer: Self-pay | Admitting: Family Medicine

## 2019-08-16 ENCOUNTER — Ambulatory Visit (INDEPENDENT_AMBULATORY_CARE_PROVIDER_SITE_OTHER): Payer: Managed Care, Other (non HMO) | Admitting: Family Medicine

## 2019-08-16 ENCOUNTER — Encounter: Payer: Self-pay | Admitting: Family Medicine

## 2019-08-16 ENCOUNTER — Other Ambulatory Visit: Payer: Self-pay

## 2019-08-16 VITALS — BP 130/70 | HR 97 | Temp 98.2°F | Ht 66.0 in | Wt 256.6 lb

## 2019-08-16 DIAGNOSIS — F419 Anxiety disorder, unspecified: Secondary | ICD-10-CM | POA: Diagnosis not present

## 2019-08-16 DIAGNOSIS — N62 Hypertrophy of breast: Secondary | ICD-10-CM

## 2019-08-16 DIAGNOSIS — M79622 Pain in left upper arm: Secondary | ICD-10-CM

## 2019-08-16 DIAGNOSIS — E559 Vitamin D deficiency, unspecified: Secondary | ICD-10-CM | POA: Diagnosis not present

## 2019-08-16 DIAGNOSIS — Z0001 Encounter for general adult medical examination with abnormal findings: Secondary | ICD-10-CM | POA: Diagnosis not present

## 2019-08-16 MED ORDER — HYDROXYZINE PAMOATE 25 MG PO CAPS: 25.0000 mg | ORAL_CAPSULE | Freq: Three times a day (TID) | ORAL | 0 refills | Status: DC | PRN

## 2019-08-16 MED ORDER — TRIAMTERENE-HCTZ 37.5-25 MG PO TABS: 1.0000 | ORAL_TABLET | Freq: Every day | ORAL | 1 refills | Status: DC

## 2019-08-16 NOTE — Patient Instructions (Signed)
Nice to see you. We will get you to see the plastic surgeon. Please start trying to increase your activity and monitor your diet. Please try the hydroxyzine for your anxiety.

## 2019-08-16 NOTE — Progress Notes (Signed)
Tommi Rumps, MD Phone: [phone]  Casey Colon is a 48 y.o. female who presents today for CPE.  Diet: Was doing weight watchers in January though has gotten away from that. Exercise: Hard to do with her work schedule working for 12-hour shifts weekly. She gets her flu vaccine at work.  Tetanus vaccine up-to-date. Pap smear up-to-date though she is status post total hysterectomy with bilateral salpingectomy.  Still has her ovaries in place.  This was for benign reasons. Family history of breast cancer in her grandmother.  No family history of ovarian cancer or colon cancer. No tobacco use or illicit drug use.  Rare alcohol use. She does not see a dentist.  Does see an ophthalmologist.  Anxiety/stress: She notes work has been quite crazy.  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  She tries to take her days off to help with this.  She does feel panicky at times.  No depression.  Mammary hypertrophy: She has back and shoulder pain related to this.  She notes the meloxicam does help with that though she wants to see plastic surgery regarding her breasts.  She does report chronic issues with left axillary pain.  Active Ambulatory Problems    Diagnosis Date Noted  . GERD (gastroesophageal reflux disease) 01/06/2017  . Vitamin D deficiency 01/06/2017  . Obesity 01/06/2017  . IBS (irritable bowel syndrome) 01/06/2017  . Bilateral lower extremity edema 01/06/2017  . Encounter for general adult medical examination with abnormal findings 02/26/2017  . Irregular menses 03/01/2018  . Chronic pain of right knee 03/01/2018  . Climacteric 04/06/2018  . Status post LAVH bilateral salpingectomy 05/09/2018  . Chronic right-sided low back pain without sciatica 05/03/2019  . Left axillary pain 08/17/2019  . Anxiety 08/17/2019  . Symptomatic mammary hypertrophy 08/17/2019   Resolved Ambulatory Problems    Diagnosis Date  Noted  . Cystocele with incomplete uterovaginal prolapse 04/06/2018  . Rectocele 04/06/2018  . Fibroids, submucosal 05/09/2018  . Status post laparoscopy-assisted vaginal hysterectomy 05/09/2018   Past Medical History:  Diagnosis Date  . Chronic headaches   . Edema     Family History  Problem Relation Age of Onset  . Asthma Maternal Grandmother   . Ovarian cancer Neg Hx   . Colon cancer Neg Hx     Social History   Socioeconomic History  . Marital status: Married    Spouse name: Not on file  . Number of children: Not on file  . Years of education: Not on file  . Highest education level: Not on file  Occupational History  . Not on file  Social Needs  . Financial resource strain: Not on file  . Food insecurity    Worry: Not on file    Inability: Not on file  . Transportation needs    Medical: Not on file    Non-medical: Not on file  Tobacco Use  . Smoking status: Never Smoker  . Smokeless tobacco: Never Used  Substance and Sexual Activity  . Alcohol use: No  . Drug use: No  . Sexual activity: Yes    Comment: same sex  Lifestyle  . Physical activity    Days per week: Not on file    Minutes per session: Not on file  . Stress: Not on file  Relationships  . Social Herbalist on phone: Not on file    Gets together: Not on file  Attends religious service: Not on file    Active member of club or organization: Not on file    Attends meetings of clubs or organizations: Not on file    Relationship status: Not on file  . Intimate partner violence    Fear of current or ex partner: Not on file    Emotionally abused: Not on file    Physically abused: Not on file    Forced sexual activity: Not on file  Other Topics Concern  . Not on file  Social History Narrative  . Not on file    ROS  General:  Negative for nexplained weight loss, fever Skin: Negative for new or changing mole, sore that won't heal HEENT: Negative for trouble hearing, trouble seeing,  ringing in ears, mouth sores, hoarseness, change in voice, dysphagia. CV:  Negative for chest pain, dyspnea, edema, palpitations Resp: Negative for cough, dyspnea, hemoptysis GI: Negative for nausea, vomiting, diarrhea, constipation, abdominal pain, melena, hematochezia. GU: Negative for dysuria, incontinence, urinary hesitance, hematuria, vaginal or penile discharge, polyuria, sexual difficulty, lumps in testicle or breasts MSK: Negative for muscle cramps or aches, joint pain or swelling Neuro: Positive for chronic headaches, negative for weakness, numbness, dizziness, passing out/fainting Psych: Positive for anxiety, negative for depression, memory problems  Objective  Physical Exam Vitals:   08/16/19 1414  BP: 130/70  Pulse: 97  Temp: 98.2 F (36.8 C)  SpO2: 98%    BP Readings from Last 3 Encounters:  08/16/19 130/70  06/21/18 122/80  05/31/18 115/77   Wt Readings from Last 3 Encounters:  08/16/19 256 lb 9.6 oz (116.4 kg)  06/21/18 250 lb 8 oz (113.6 kg)  05/31/18 249 lb 12.8 oz (113.3 kg)    Physical Exam Constitutional:      General: She is not in acute distress.    Appearance: She is not diaphoretic.  HENT:     Head: Normocephalic and atraumatic.  Eyes:     Conjunctiva/sclera: Conjunctivae normal.     Pupils: Pupils are equal, round, and reactive to light.  Cardiovascular:     Rate and Rhythm: Normal rate and regular rhythm.     Heart sounds: Normal heart sounds.  Pulmonary:     Effort: Pulmonary effort is normal.     Breath sounds: Normal breath sounds.  Abdominal:     General: Bowel sounds are normal. There is no distension.     Palpations: Abdomen is soft.     Tenderness: There is no abdominal tenderness. There is no guarding or rebound.  Genitourinary:    Comments: Chaperone used, bilateral breast with no skin changes, nipple inversion, masses, or tenderness, no axillary masses or tenderness Musculoskeletal:     Right lower leg: No edema.     Left  lower leg: No edema.  Lymphadenopathy:     Cervical: No cervical adenopathy.  Skin:    General: Skin is warm and dry.  Neurological:     Mental Status: She is alert.  Psychiatric:     Comments: Mood anxious, affect normal      Assessment/Plan:   Encounter for general adult medical examination with abnormal findings Physical exam completed.  Encouraged trying to work in some walking for exercise on her days off.  Encouraged healthy diet.  Diagnostic mammogram of the left breast and ultrasound of the left breast ordered given reported tenderness.  Prior mammogram benign.  Check vitamin D.  Anxiety Stressful job and the COVID-19 pandemic is contributing to this.  She does not want  anything she would take every day.  We will trial hydroxyzine as needed.  Discussed risk of drowsiness with this and advised not to drive or work if she develops drowsiness with this.  Left axillary pain Mammogram and ultrasound ordered.  Symptomatic mammary hypertrophy Refer to plastic surgery for evaluation.   Orders Placed This Encounter  Procedures  . US BREAST LTD UNI LEFT INC AXILLA    Standing Status:   Future    Standing Expiration Date:   10/16/2020    Order Specific Question:   Reason for Exam (SYMPTOM  OR DIAGNOSIS REQUIRED)    Answer:   left axillary pain and tenderness at edge of left breast, no palpable abnormalities    Order Specific Question:   Preferred imaging location?    Answer:   [facility]  . MM DIAG BREAST TOMO UNI LEFT    Standing Status:   Future    Standing Expiration Date:   10/16/2020    Order Specific Question:   Reason for Exam (SYMPTOM  OR DIAGNOSIS REQUIRED)    Answer:   left axillary pain and tenderness at edge of left breast, no palpable abnormalities    Order Specific Question:   Is the patient pregnant?    Answer:   No    Order Specific Question:   Preferred imaging location?    Answer:   [facility]  . Vitamin D (25 hydroxy)  . Ambulatory referral  to Plastic Surgery    Referral Priority:   Routine    Referral Type:   Surgical    Referral Reason:   Specialty Services Required    Requested Specialty:   Plastic Surgery    Number of Visits Requested:   1    Meds ordered this encounter  Medications  . triamterene-hydrochlorothiazide (MAXZIDE-25) 37.5-25 MG tablet    Sig: Take 1 tablet by mouth daily.    Dispense:  90 tablet    Refill:  1  . hydrOXYzine (VISTARIL) 25 MG capsule    Sig: Take 1 capsule (25 mg total) by mouth 3 (three) times daily as needed.    Dispense:  30 capsule    Refill:  0     Tommi Rumps, MD [place]

## 2019-08-17 DIAGNOSIS — F419 Anxiety disorder, unspecified: Secondary | ICD-10-CM | POA: Insufficient documentation

## 2019-08-17 DIAGNOSIS — M79622 Pain in left upper arm: Secondary | ICD-10-CM | POA: Insufficient documentation

## 2019-08-17 DIAGNOSIS — N62 Hypertrophy of breast: Secondary | ICD-10-CM | POA: Insufficient documentation

## 2019-08-17 LAB — VITAMIN D 25 HYDROXY (VIT D DEFICIENCY, FRACTURES): VITD: 31.4 ng/mL (ref 30.00–100.00)

## 2019-08-17 NOTE — Assessment & Plan Note (Signed)
Physical exam completed.  Encouraged trying to work in some walking for exercise on her days off.  Encouraged healthy diet.  Diagnostic mammogram of the left breast and ultrasound of the left breast ordered given reported tenderness.  Prior mammogram benign.  Check vitamin D.

## 2019-08-17 NOTE — Assessment & Plan Note (Signed)
Mammogram and ultrasound ordered

## 2019-08-17 NOTE — Assessment & Plan Note (Signed)
Refer to plastic surgery for evaluation.

## 2019-08-17 NOTE — Assessment & Plan Note (Signed)
Stressful job and the COVID-19 pandemic is contributing to this.  She does not want anything she would take every day.  We will trial hydroxyzine as needed.  Discussed risk of drowsiness with this and advised not to drive or work if she develops drowsiness with this.

## 2019-09-06 ENCOUNTER — Other Ambulatory Visit: Payer: Self-pay | Admitting: Family Medicine

## 2019-10-06 ENCOUNTER — Ambulatory Visit (INDEPENDENT_AMBULATORY_CARE_PROVIDER_SITE_OTHER): Payer: Managed Care, Other (non HMO) | Admitting: Plastic Surgery

## 2019-10-06 ENCOUNTER — Other Ambulatory Visit: Payer: Self-pay

## 2019-10-06 ENCOUNTER — Encounter: Payer: Self-pay | Admitting: Plastic Surgery

## 2019-10-06 VITALS — BP 126/85 | HR 80 | Temp 96.8°F | Ht 66.0 in | Wt 257.6 lb

## 2019-10-06 DIAGNOSIS — M546 Pain in thoracic spine: Secondary | ICD-10-CM | POA: Diagnosis not present

## 2019-10-06 DIAGNOSIS — G8929 Other chronic pain: Secondary | ICD-10-CM | POA: Diagnosis not present

## 2019-10-06 DIAGNOSIS — N62 Hypertrophy of breast: Secondary | ICD-10-CM | POA: Diagnosis not present

## 2019-10-06 DIAGNOSIS — M542 Cervicalgia: Secondary | ICD-10-CM | POA: Insufficient documentation

## 2019-10-06 DIAGNOSIS — M549 Dorsalgia, unspecified: Secondary | ICD-10-CM | POA: Insufficient documentation

## 2019-10-06 NOTE — Progress Notes (Signed)
Patient ID: Casey Colon, female    DOB: 06/12/1971, 48 y.o.   MRN: [id number]   Chief Complaint  Patient presents with  . Breast Problem    Mammary Hyperplasia: The patient is a 48 y.o. female with a history of mammary hyperplasia for several years.  She has extremely large breasts causing symptoms that include the following: Back pain in the upper and lower back, including neck pain. She pulls or pins her bra straps to provide better lift and relief of the pressure and pain. She notices relief by holding her breast up manually.  Her shoulder straps cause grooves and pain and pressure that requires padding for relief. Pain medication is sometimes required with motrin and tylenol.  Activities that are hindered by enlarged breasts include: exercise and running.  She had a mammogram in June which was normal.  She is scheduled for a repeat mammogram in November due to left axillary tenderness.  She does have a family history of breast cancer in her maternal grandmother.  She is not a smoker.  Her breasts are extremely large and fairly symmetric.  She has hyperpigmentation of the inframammary area on both sides.  The sternal to nipple distance on the right is 39 cm and the left is 40 cm.  The IMF distance is 18 cm.  She is 5 feet 6 inches tall and weighs 257 pounds.  Preoperative bra size = 40 DD cup.  The estimated excess breast tissue to be removed at the time of surgery = 850 grams on the left and 850 grams on the right.     Review of Systems  Constitutional: Positive for activity change. Negative for appetite change.  HENT: Negative.   Eyes: Negative.   Respiratory: Negative.  Negative for chest tightness.   Cardiovascular: Negative for leg swelling.  Gastrointestinal: Negative for abdominal distention and abdominal pain.  Endocrine: Negative.   Genitourinary: Negative.   Musculoskeletal: Positive for back pain and neck pain.  Skin: Negative.   Neurological: Negative.   Hematological:  Negative.   Psychiatric/Behavioral: Negative.     Past Medical History:  Diagnosis Date  . Chronic headaches   . Edema   . GERD (gastroesophageal reflux disease)   . IBS (irritable bowel syndrome)     Past Surgical History:  Procedure Laterality Date  . LAPAROSCOPIC VAGINAL HYSTERECTOMY WITH SALPINGECTOMY Bilateral 05/09/2018   Procedure: LAPAROSCOPIC ASSISTED VAGINAL HYSTERECTOMY WITH BILATERAL  SALPINGECTOMY;  Surgeon: [name], MD;  Location: [facility] ORS;  Service: Gynecology;  Laterality: Bilateral;  . NO PAST SURGERIES        Current Outpatient Medications:  .  aspirin-acetaminophen-caffeine (EXCEDRIN MIGRAINE) 250-250-65 MG tablet, Take 1 tablet by mouth every 6 (six) hours as needed for headache., Disp: , Rfl:  .  calcium carbonate (TUMS - DOSED IN MG ELEMENTAL CALCIUM) 500 MG chewable tablet, Chew 1-2 tablets by mouth 2 (two) times daily as needed (for heartburn/indigestion.). , Disp: , Rfl:  .  Cholecalciferol (VITAMIN D3) 2000 units TABS, Take 2,000 Units by mouth daily., Disp: , Rfl:  .  famotidine (PEPCID) 20 MG tablet, Take 20 mg by mouth 2 (two) times daily., Disp: , Rfl:  .  fexofenadine (ALLEGRA) 180 MG tablet, Take 180 mg by mouth daily as needed for allergies., Disp: , Rfl:  .  hydrOXYzine (VISTARIL) 25 MG capsule, Take 1 capsule (25 mg total) by mouth 3 (three) times daily as needed., Disp: 30 capsule, Rfl: 0 .  meloxicam (MOBIC) 7.5  MG tablet, TAKE 1 TABLET(7.5 MG) BY MOUTH DAILY, Disp: 30 tablet, Rfl: 0 .  ondansetron (ZOFRAN ODT) 4 MG disintegrating tablet, Take 1 tablet (4 mg total) by mouth every 6 (six) hours as needed for nausea., Disp: 20 tablet, Rfl: 0 .  ranitidine (ZANTAC) 150 MG tablet, TAKE 1 TABLET(150 MG) BY MOUTH TWICE DAILY, Disp: 180 tablet, Rfl: 1 .  triamterene-hydrochlorothiazide (MAXZIDE-25) 37.5-25 MG tablet, Take 1 tablet by mouth daily., Disp: 90 tablet, Rfl: 1   Objective:   Vitals:   10/06/19 0822  BP: 126/85  Pulse: 80  Temp:  (!) 96.8 F (36 C)  SpO2: 96%    Physical Exam Vitals signs and nursing note reviewed.  Constitutional:      Appearance: Normal appearance.  HENT:     Head: Normocephalic.  Eyes:     Extraocular Movements: Extraocular movements intact.  Neck:     Musculoskeletal: Normal range of motion.  Cardiovascular:     Rate and Rhythm: Normal rate.     Pulses: Normal pulses.  Pulmonary:     Effort: Pulmonary effort is normal.  Abdominal:     General: Abdomen is flat. There is no distension.     Tenderness: There is no abdominal tenderness.  Skin:    General: Skin is warm.     Capillary Refill: Capillary refill takes less than 2 seconds.  Neurological:     General: No focal deficit present.     Mental Status: She is alert and oriented to person, place, and time.  Psychiatric:        Mood and Affect: Mood normal.        Behavior: Behavior normal.        Thought Content: Thought content normal.     Assessment & Plan:  Symptomatic mammary hypertrophy  Chronic bilateral thoracic back pain  Neck pain  Recommend breast reduction with liposuction.   Pictures were obtained of the patient and placed in the chart with the patient's or guardian's permission. Will start PT and will need Mammogram results.  [place], DO

## 2019-10-06 NOTE — Addendum Note (Signed)
Addended by: Wallace Going on: 10/06/2019 09:51 AM   Modules accepted: Orders

## 2019-10-16 ENCOUNTER — Telehealth: Payer: Self-pay | Admitting: Plastic Surgery

## 2019-10-16 NOTE — Telephone Encounter (Signed)
LVM for patient to return call. Initial authorization request to [employer]uermann indicates that the g/bsa must increase to meet medical guidelines. Patient is currently undergoing PT per advisement of Dr. Marla Roe, which will support the efforts to get her surgery approved. Once PT is completed, patient should follow up with Dr. Marla Roe to reassess g/bsa and have PA request resubmitted to insurance.

## 2019-10-18 ENCOUNTER — Encounter: Payer: Self-pay | Admitting: Physical Therapy

## 2019-10-18 ENCOUNTER — Other Ambulatory Visit: Payer: Self-pay

## 2019-10-18 ENCOUNTER — Ambulatory Visit: Payer: Managed Care, Other (non HMO) | Attending: Plastic Surgery | Admitting: Physical Therapy

## 2019-10-18 DIAGNOSIS — G8929 Other chronic pain: Secondary | ICD-10-CM | POA: Diagnosis present

## 2019-10-18 DIAGNOSIS — M546 Pain in thoracic spine: Secondary | ICD-10-CM | POA: Diagnosis present

## 2019-10-18 DIAGNOSIS — M542 Cervicalgia: Secondary | ICD-10-CM | POA: Insufficient documentation

## 2019-10-18 DIAGNOSIS — M545 Low back pain, unspecified: Secondary | ICD-10-CM

## 2019-10-18 DIAGNOSIS — M6281 Muscle weakness (generalized): Secondary | ICD-10-CM | POA: Insufficient documentation

## 2019-10-18 NOTE — Therapy (Signed)
[facility] [address] [place], [address] Phone: [phone]   Fax:  [phone]  Physical Therapy Treatment  Patient Details  Name: Casey Colon MRN: [id number] Date of Birth: 1971/03/28 Referring Provider (PT):  Marla Roe Loel Lofty, DO   Encounter Date: 10/18/2019  PT End of Session - 10/19/19 1624    Visit Number  1    Number of Visits  12    Date for PT Re-Evaluation  11/30/19    Authorization Type  [employer] MANAGED reporting from 10/19/2019    Authorization - Visit Number  1    Authorization - Number of Visits  10    PT Start Time  1600    PT Stop Time  1702    PT Time Calculation (min)  62 min    Activity Tolerance  Patient tolerated treatment well    Behavior During Therapy  [facility] for tasks assessed/performed       Past Medical History:  Diagnosis Date  . Chronic headaches   . Edema   . GERD (gastroesophageal reflux disease)   . IBS (irritable bowel syndrome)     Past Surgical History:  Procedure Laterality Date  . LAPAROSCOPIC VAGINAL HYSTERECTOMY WITH SALPINGECTOMY Bilateral 05/09/2018   Procedure: LAPAROSCOPIC ASSISTED VAGINAL HYSTERECTOMY WITH BILATERAL  SALPINGECTOMY;  Surgeon: [name], MD;  Location: [facility] ORS;  Service: Gynecology;  Laterality: Bilateral;  . NO PAST SURGERIES      There were no vitals filed for this visit.  Subjective Assessment - 10/18/19 1615    Subjective  Patient stated she has a history of chronic low back pain that came on with a prior episode 8 years ago when she pulled her back at work.  She also has problems with neck burning when typing on computers at work. The pain is currently 2/10, and the worse pain can go up to 8/10. Pain woke patient up due to back pain at night with position changes. Patient modified sleeping position with sleeping on L side and pillow between her legs. Patient is currently working as a Marine scientist and involves lifting, bending,  and working in Pension scheme manager. Patient enjoys playing sports, shopping, and washing cars. Patient also stated that her tail bone and hip started hurting hurts when prolonged sitting. She also states that her LEs felt restless constantly and need to take medication to control when the feeling is severe. She reports she is trying to get a breast reduction to help take pressure off her spine to improve her pain. She also has chronic R knee pain from playing basketball years ago.    Pertinent History  Patient has PMH include: irritable bowel syndrome, chronic low back pain, and history of laparoscopic vaginal histerectomy with salpingectomy.    Limitations  Walking;Writing;Standing;House hold activities    How long can you sit comfortably?  15 min    How long can you stand comfortably?  15 min    How long can you walk comfortably?  30 min    Diagnostic tests  X-ray: Lower lumbar osteoarthritic change, primarily at L5-S1. No fractureor spondylolisthesis.    Patient Stated Goals  Reduce the pain at her lower back    Currently in Pain?  Yes    Pain Score  2     Pain Location  Back    Pain Orientation  Lower    Pain Descriptors / Indicators  Constant;Dull;Aching    Pain Type  Chronic pain  Pain Radiating Towards  from back to hip    Pain Onset  Today    Pain Frequency  Constant    Aggravating Factors   Prolonged sitting/walking/standing; lifting; bending; going up/down stairs larger than three steps.    Pain Relieving Factors  Pain, heating, cold pad    Effect of Pain on Daily Activities  going up/down stairs larger than three steps, tie shoes, sleep on back.    Multiple Pain Sites  Yes    Pain Score  7    Pain Location  Neck    Pain Orientation  Left;Right    Pain Descriptors / Indicators  Burning    Pain Type  Chronic pain    Pain Onset  More than a month ago    Pain Frequency  Intermittent    Aggravating Factors   increased UEs usage such as, typing at work, washing dishes.    Pain  Relieving Factors  rest, sitting back    Effect of Pain on Daily Activities  Standing washing dishes, without using bras,      [facility] PT Assessment - 10/19/19 0001      Assessment   Medical Diagnosis  Symptomatic mammary hypertrophy, Chronic bilateral thoracic back pain, and Neck pain    Referring Provider (PT)   Dillingham, Loel Lofty, DO    Hand Dominance  Right    Next MD Visit  12/15/2019    Prior Therapy  No       Precautions   Precautions  None      Balance Screen   Has the patient fallen in the past 6 months  No      [place] residence    Living Arrangements  Spouse/significant other    Available Help at Discharge  Family      Prior Function   Level of Independence  Independent    Vocation Requirements  Nursing      Cognition   Overall Cognitive Status  Within Functional Limits for tasks assessed      Observation/Other Assessments   Other Surveys   Oswestry Disability Index    Oswestry Disability Index   mODI = 36%         OBJECTIVE  OBSERVATION Large breasts with compression of shoulders where bra-straps rest.   SENSATION: Grossly intact to light touch bilateral UE as determined by testing dermatomes C2-T2   MUSCULOSKELETAL: Tremor: None Bulk: Normal  Posture Forward neck and protracted shoulder during sitting.    Palpation No pain at posterior neck, but pain during UPA on R lower back at T10 to L5.   Strength R/L 5/5 Shoulder flexion 4+/+ Shoulder abduction  5/5 Shoulder external rotation  5/5 Shoulder internal rotation 5/5 Shoulder extension  5/5 Elbow flexion 5/5 Elbow extension   AROM R/L Cervical Flexion: no pain, WFL, lack of cervical flexion.  Cervical Extension: [facility] 25/20 Cervical Lateral Flexion Cervical Rotation: [facility]  Lumbar flexion: WFL no pain Lumbar extension: 50% restricted. pain full at end range*.  *Indicates pain  Repeated Movements No centralization or peripheralization of symptoms  with repeated lumbar flexion; slight discomfort during lumbar extension but able to recover to baseline.    Passive Accessory Intervertebral Motion (PAIVM) Pt denies reproduction of neck pain with CPA C2-T7 and UPA bilaterally C2-T7.     TREATMENT:  Therapeutic exercise: to centralize symptoms and improve ROM, strength, muscular endurance, and activity tolerance required for successful completion of functional activities.  - Occipital cervical stretching  4x10 s each.  - Blue therapy band seated D2 resisted flexion. - Education on posture and importance of body biomechanics with pian.   - Patient was educated on diagnosis, anatomy and pathology involved, prognosis, role of PT, and was given an HEP, demonstrating exercise with proper form following verbal and tactile cues, and was given a paper hand out to continue exercise at home. Pt was educated on and agreed to plan of care.   HOME EXERCISE PROGRAM Access Code: [email or web address]  URL: [email or web address]  Date: 10/19/2019  Prepared by: Rosita Kea   Exercises Sub-Occipital Cervical Stretch - 3 sets - 30s hold - 2x daily Shoulder PNF D2 with Resistance - 3 sets - 10 reps - 2x daily    PT Education - 10/19/19 1624    Education Details  Exercise purpose/form. Self management techniques. Education on diagnosis, prognosis, POC, anatomy and physiology of current condition Education on HEP including handout    Person(s) Educated  Patient    Methods  Explanation;Demonstration;Tactile cues;Verbal cues;Handout    Comprehension  Verbalized understanding;Returned demonstration;Verbal cues required;Tactile cues required       PT Short Term Goals - 10/19/19 1633      PT SHORT TERM GOAL #1   Title  Be independent with initial home exercise program for self-management of symptoms.    Baseline  initial HEP provided at IE (10/18/2019);    Time  2    Period  Weeks    Status  New    Target Date  11/02/19        PT Long Term Goals  - 10/19/19 1634      PT LONG TERM GOAL #1   Title  Be independent with a long-term home exercise program for self-management of symptoms.    Baseline  initial HEP provided at IE (10/18/2019);    Time  6    Period  Weeks    Status  New    Target Date  11/30/19      PT LONG TERM GOAL #2   Title  Demonstrate improve cervical lateral flexion at least 15 degrees to improve ROM decrease stifness and pain control.    Baseline  R:25/L:20 degrees Cervical Lateral Flexion(10/18/2019);    Time  6    Period  Weeks    Status  New    Target Date  11/30/19      PT LONG TERM GOAL #3   Title  Complete community, work and/or recreational activities without limitation due to current condition.    Baseline  prolonged sitting/walking/standing; typing in front of computer at work; lifting patients(10/18/2019);    Time  6    Period  Weeks    Status  New    Target Date  11/30/19      PT LONG TERM GOAL #4   Title  Reduce pain with functional activities to equal or less than 1/10 to allow patient to complete usual activities including ADLs, IADLs, and social engagement with less difficulty.    Baseline  2/10 at lower back and 7/10 burning feeling at neck while working in front of computer. (10/18/2019)    Time  6    Period  Weeks    Status  New    Target Date  11/30/19      PT LONG TERM GOAL #5   Title  Demonstrate reduce mODI score by 10 units to demonstrate improvement in overall condition and self-reported functional ability.    Baseline  38% (10/18/2019);    Time  6    Period  Weeks    Status  New    Target Date  11/30/19            Plan - 10/19/19 1625    Clinical Impression Statement  Patient is a 48 y.o. female who presents to outpatient physical therapy with a referral for medical diagnosis of Symptomatic mammary hypertrophy, Chronic bilateral thoracic back pain, and Neck pain. This patient presents with the sign and symptoms consistent with neck and low back pain, stiffness, with  functional activities/recreational activities. Upon assessment, pt demonstrated deficits such as deficits in strength, mobility, weakness, and limited cervical ROM. These deficits limit the patient ability to perform things such as ADLs, IADLs, social participation, caring for others, engaging in hobbies (playing sports), and impairs their quality of life. The pt will benefit from skilled PT services to address deficits and return to PLOF and independence, recreational activity and work.    Personal Factors and Comorbidities  Past/Current Experience;Time since onset of injury/illness/exacerbation;Comorbidity 2    Comorbidities  irritable bowel syndrome, chronic low back pain, and history of laparoscopic vaginal histerectomy with salpingectomy.    Examination-Activity Limitations  Bathing;Bed Mobility;Dressing;Hygiene/Grooming;Bend;Lift;Sleep;Sit;Carry;Stand   push, pull   Examination-Participation Restrictions  Other;Laundry   typing and work on computers at home, handling patients at work   Stability/Clinical Decision Making  Evolving/Moderate complexity    Clinical Decision Making  Moderate    Rehab Potential  Good    PT Frequency  2x / week    PT Duration  6 weeks    PT Treatment/Interventions  ADLs/Self Care Home Management;Electrical Stimulation;Dry needling;Joint Manipulations;Spinal Manipulations;Manual techniques;Neuromuscular re-education;Therapeutic exercise;Therapeutic activities;Functional mobility training;Cryotherapy;Moist Heat;Traction;Ultrasound;Patient/family education;Passive range of motion    PT Next Visit Plan  Stretching, strengthening, postural training    PT Home Exercise Plan  Medbridge [email or web address]    Consulted and Agree with Plan of Care  Patient       Patient will benefit from skilled therapeutic intervention in order to improve the following deficits and impairments:  Decreased activity tolerance, Decreased endurance, Decreased range of motion, Decreased strength, Pain,  Impaired flexibility, Impaired perceived functional ability, Improper body mechanics, Postural dysfunction  Visit Diagnosis: Cervicalgia  Muscle weakness (generalized)  Chronic bilateral low back pain without sciatica  Pain in thoracic spine     Problem List Patient Active Problem List   Diagnosis Date Noted  . Back pain 10/06/2019  . Neck pain 10/06/2019  . Left axillary pain 08/17/2019  . Anxiety 08/17/2019  . Symptomatic mammary hypertrophy 08/17/2019  . Chronic right-sided low back pain without sciatica 05/03/2019  . Status post LAVH bilateral salpingectomy 05/09/2018  . Climacteric 04/06/2018  . Irregular menses 03/01/2018  . Chronic pain of right knee 03/01/2018  . Encounter for general adult medical examination with abnormal findings 02/26/2017  . GERD (gastroesophageal reflux disease) 01/06/2017  . Vitamin D deficiency 01/06/2017  . Obesity 01/06/2017  . IBS (irritable bowel syndrome) 01/06/2017  . Bilateral lower extremity edema 01/06/2017    Sherrilyn Rist, SPT 10/19/19, 6:42 PM  Everlean Alstrom. Graylon Good, PT, DPT 10/19/19, 6:42 PM  [facility] [address] [place], [address] Phone: [phone]   Fax:  [phone]  Name: Casey Colon MRN: [id number] Date of Birth: [birth year]

## 2019-10-25 ENCOUNTER — Ambulatory Visit: Payer: Managed Care, Other (non HMO) | Admitting: Physical Therapy

## 2019-10-31 ENCOUNTER — Ambulatory Visit: Payer: Managed Care, Other (non HMO) | Admitting: Physical Therapy

## 2019-11-07 ENCOUNTER — Encounter: Payer: Managed Care, Other (non HMO) | Admitting: Physical Therapy

## 2019-11-15 ENCOUNTER — Ambulatory Visit: Payer: Managed Care, Other (non HMO) | Attending: Plastic Surgery | Admitting: Physical Therapy

## 2019-11-15 ENCOUNTER — Other Ambulatory Visit: Payer: Self-pay

## 2019-11-15 ENCOUNTER — Encounter: Payer: Self-pay | Admitting: Physical Therapy

## 2019-11-15 DIAGNOSIS — M546 Pain in thoracic spine: Secondary | ICD-10-CM | POA: Insufficient documentation

## 2019-11-15 DIAGNOSIS — G8929 Other chronic pain: Secondary | ICD-10-CM

## 2019-11-15 DIAGNOSIS — M545 Low back pain, unspecified: Secondary | ICD-10-CM

## 2019-11-15 DIAGNOSIS — M6281 Muscle weakness (generalized): Secondary | ICD-10-CM | POA: Diagnosis present

## 2019-11-15 DIAGNOSIS — M542 Cervicalgia: Secondary | ICD-10-CM | POA: Diagnosis present

## 2019-11-15 NOTE — Therapy (Signed)
[facility] [address], [place], [address] Phone: [phone]   Fax:  [phone]  Physical Therapy Treatment  Patient Details  Name: Casey Colon MRN: [id number] Date of Birth: 11-27-1971 Referring Provider (PT):  Marla Roe Loel Lofty, DO   Encounter Date: 11/15/2019  PT End of Session - 11/15/19 1104    Visit Number  2    Number of Visits  12    Date for PT Re-Evaluation  11/30/19    Authorization Type  [employer] MANAGED reporting from 10/19/2019    Authorization - Visit Number  2    Authorization - Number of Visits  10    PT Start Time  0945    PT Stop Time  1030    PT Time Calculation (min)  45 min    Activity Tolerance  Patient tolerated treatment well    Behavior During Therapy  [facility] for tasks assessed/performed       Past Medical History:  Diagnosis Date  . Chronic headaches   . Edema   . GERD (gastroesophageal reflux disease)   . IBS (irritable bowel syndrome)     Past Surgical History:  Procedure Laterality Date  . LAPAROSCOPIC VAGINAL HYSTERECTOMY WITH SALPINGECTOMY Bilateral 05/09/2018   Procedure: LAPAROSCOPIC ASSISTED VAGINAL HYSTERECTOMY WITH BILATERAL  SALPINGECTOMY;  Surgeon: [name], MD;  Location: [facility] ORS;  Service: Gynecology;  Laterality: Bilateral;  . NO PAST SURGERIES      There were no vitals filed for this visit.   OBJECTIVE   SPINE MOTION Lumbar AROM *Indicates pain - Flexion: = mid shins, ERP. - Extension: = 75%, painful - Rotation: R = WFL but increased R pain, L = WFL. - Side Glide: R = WFL, L = WFL (but painful on R both side).  REPEATED MOTIONS TESTING: Pain 3/10 at start localized to R lumbar spine. Motion/Technique sets x reps During After ROM Functional test** [place] Comments  Prone press up x10,  End range pain Back to no pain in prone    "feels good to hurt"  Prone press with lock and sag. x10 Pain at end range No worse or better  standing       Flex/rotation with clinician OP x10 End range pain No obvious effect.                         **Functional test:  Comments: Subjective Assessment - 11/15/19 0946    Subjective  Patient reports she has been unable to come to physical therapy due to [place] trouble. She states she feels pretty good this morning because her pain is usually worse at night. She states that work needs have taken over her attempts to work out. She has not been doing her HEP and has lost the handout and theraband provided at initial eval. She states she has some soreness in her low back today rated 3/10 in the R lumbar spine upon arrival. States she still has neck pain but she thinks it is caused by work and is not her primary concern. She states she feels like she probably needs back surgery and would like to get an MRI of the lumbar spine to understand what is going on there better.    Pertinent History  Patient has PMH include: irritable bowel syndrome, chronic low back pain, and history of laparoscopic vaginal histerectomy with salpingectomy.    Limitations  Walking;Writing;Standing;House hold activities    How long  can you sit comfortably?  15 min    How long can you stand comfortably?  15 min    How long can you walk comfortably?  30 min    Diagnostic tests  X-ray: Lower lumbar osteoarthritic change, primarily at L5-S1. No fractureor spondylolisthesis.    Patient Stated Goals  Reduce the pain at her lower back    Currently in Pain?  Yes    Pain Score  3     Pain Location  Back    Pain Orientation  Right;Lower    Pain Descriptors / Indicators  Aching    Pain Onset  Today    Pain Onset  More than a month ago        TREATMENT:  Therapeutic exercise:to centralize symptoms and improve ROM, strength, muscular endurance, and activity tolerance required for successful completion of functional activities.  - lumbar ROM assessments before and after repeated motions testing to assist with daily  treatment planning.  - prone press up 2x10 (second set with lock and sag) to improve ROM and attempt to centralize symptoms. Did improve ability to complete and tolerate prone on elbows, no peripheralization, no obvious great improvement either. Added to HEP for longer term trial period and no adverse effects noted during session.  - lumbar extension in standing x 5 to practice for HEP when unable to lie prone. ERP each time but no worse after.  - lower trunk rotation, x 3 minutes.  - attempted neutral bridge, discontinued due to patient being unable to complete without posterior pelvic tilt and pain after attempts made before and after glute sets.  - hooklying glute set with abdominal brace with extensive cuing to prevent pelvic tilt during glute set for improved tolerance and lumbopelvic control. After understanding technique completed x20 with 5 second holds. Added to HEP.  - Education on HEP including handout  - discussed importance of regular attendance to make progress in physical therapy, especially at the start. Discussed likelihood POC may need to be extended due to almost 1 month absence from PT following IE.   Manual therapy: to reduce pain and tissue tension, improve range of motion, neuromodulation, in order to promote improved ability to complete functional activities. - flexion/rotation mobilization for lumbar spine (legs to R) x 10 to centralize and improve pain. ERP noted during each rep and "good stretch" but no significant changes after.  - prone CPA along spine from mid thoracic to lowest segment of lumbar spine, grade III x 10 each segment using KE wedge.  - prone R UPA grade III along lumbar region in tender area using hands. Several reps each level, uncomfortable but no worse following.  - STM to lumbar paraspinals, R >L, to improve pain and decrease symptoms.  - palpation of R glute - not tender.   HOME EXERCISE PROGRAM Access Code: [email or web address]  URL:  [email or web address]  Date: 11/15/2019  Prepared by: Rosita Kea   Exercises Prone Press Up - 10-15 reps - 1 second hold - 4x daily Standing Lumbar Extension - 10-15 reps - 1 sets - 1 second hold - 4x daily Hooklying Gluteal Sets - 20 reps - 5 second hold hold - 1x daily    PT Education - 11/15/19 1103    Education Details  Exercise purpose/form. Self management techniques. Education on importance of regular visits, POC, Education on HEP including handout    Person(s) Educated  Patient    Methods  Explanation;Demonstration;Tactile cues;Verbal cues;Handout    Comprehension  Verbalized understanding;Returned demonstration;Verbal  cues required;Tactile cues required;Need further instruction       PT Short Term Goals - 11/15/19 1118      PT SHORT TERM GOAL #1   Title  Be independent with initial home exercise program for self-management of symptoms.    Baseline  initial HEP provided at IE (10/18/2019);    Time  2    Period  Weeks    Status  On-going    Target Date  11/02/19        PT Long Term Goals - 10/19/19 1634      PT LONG TERM GOAL #1   Title  Be independent with a long-term home exercise program for self-management of symptoms.    Baseline  initial HEP provided at IE (10/18/2019);    Time  6    Period  Weeks    Status  New    Target Date  11/30/19      PT LONG TERM GOAL #2   Title  Demonstrate improve cervical lateral flexion at least 15 degrees to improve ROM decrease stifness and pain control.    Baseline  R:25/L:20 degrees Cervical Lateral Flexion(10/18/2019);    Time  6    Period  Weeks    Status  New    Target Date  11/30/19      PT LONG TERM GOAL #3   Title  Complete community, work and/or recreational activities without limitation due to current condition.    Baseline  prolonged sitting/walking/standing; typing in front of computer at work; lifting patients(10/18/2019);    Time  6    Period  Weeks    Status  New    Target Date  11/30/19       PT LONG TERM GOAL #4   Title  Reduce pain with functional activities to equal or less than 1/10 to allow patient to complete usual activities including ADLs, IADLs, and social engagement with less difficulty.    Baseline  2/10 at lower back and 7/10 burning feeling at neck while working in front of computer. (10/18/2019)    Time  6    Period  Weeks    Status  New    Target Date  11/30/19      PT LONG TERM GOAL #5   Title  Demonstrate reduce mODI score by 10 units to demonstrate improvement in overall condition and self-reported functional ability.    Baseline  38% (10/18/2019);    Time  6    Period  Weeks    Status  New    Target Date  11/30/19            Plan - 11/15/19 1105    Clinical Impression Statement  Patient tolerated treatment well overall and was able to learn improved glute activation without moving lumbar spine but needs further practice to gain proper motor control before being ready to advance to bridge. Able to perform repeated extension and flexion rotation exercise/mobilization without any peripheralization but also no obvious improvement except mildly improved tolerance for resting prone on elbows. Patient continues to have motor control, pain, stiffness, strength, and activity tolerance impairments that are leading to difficulty with lifting, bending, sports, caring for others, working, and decreasing QOL. Patient would benefit from continued physical therapy to address remaining impairments and functional limitations to work towards stated goals and return to PLOF or maximal functional independence.    Personal Factors and Comorbidities  Past/Current Experience;Time since onset of injury/illness/exacerbation;Comorbidity 2    Comorbidities  irritable bowel  syndrome, chronic low back pain, and history of laparoscopic vaginal histerectomy with salpingectomy.    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, handling patients at work   Stability/Clinical Decision Making  Evolving/Moderate complexity    Rehab Potential  Good    PT Frequency  2x / week    PT Duration  6 weeks    PT Treatment/Interventions  ADLs/Self Care Home Management;Electrical Stimulation;Dry needling;Joint Manipulations;Spinal Manipulations;Manual techniques;Neuromuscular re-education;Therapeutic exercise;Therapeutic activities;Functional mobility training;Cryotherapy;Moist Heat;Traction;Ultrasound;Patient/family education;Passive range of motion    PT Next Visit Plan  Stretching, strengthening, postural training    PT Home Exercise Plan  Med[employer] [id number]    Consulted and Agree with Plan of Care  Patient       Patient will benefit from skilled therapeutic intervention in order to improve the following deficits and impairments:  Decreased activity tolerance, Decreased endurance, Decreased range of motion, Decreased strength, Pain, Impaired flexibility, Impaired perceived functional ability, Improper body mechanics, Postural dysfunction  Visit Diagnosis: Cervicalgia  Muscle weakness (generalized)  Chronic bilateral low back pain without sciatica  Pain in thoracic spine     Problem List Patient Active Problem List   Diagnosis Date Noted  . Back pain 10/06/2019  . Neck pain 10/06/2019  . Left axillary pain 08/17/2019  . Anxiety 08/17/2019  . Symptomatic mammary hypertrophy 08/17/2019  . Chronic right-sided low back pain without sciatica 05/03/2019  . Status post LAVH bilateral salpingectomy 05/09/2018  . Climacteric 04/06/2018  . Irregular menses 03/01/2018  . Chronic pain of right knee 03/01/2018  . Encounter for general adult medical examination with abnormal findings 02/26/2017  . GERD (gastroesophageal reflux disease) 01/06/2017  .  Vitamin D deficiency 01/06/2017  . Obesity 01/06/2017  . IBS (irritable bowel syndrome) 01/06/2017  . Bilateral lower extremity edema 01/06/2017    Everlean Alstrom. Graylon Good, PT, DPT 11/15/19, 11:21 AM  [facility] [address] [place], [address] Phone: [phone]   Fax:  [phone]  Name: Casey Colon MRN: [id number] Date of Birth: [birth year]

## 2019-11-17 ENCOUNTER — Ambulatory Visit: Payer: Managed Care, Other (non HMO) | Admitting: Family Medicine

## 2019-11-20 ENCOUNTER — Ambulatory Visit: Payer: Managed Care, Other (non HMO) | Admitting: Physical Therapy

## 2019-11-20 ENCOUNTER — Telehealth: Payer: Self-pay

## 2019-11-20 MED ORDER — MELOXICAM 7.5 MG PO TABS: ORAL_TABLET | ORAL | 1 refills | Status: DC

## 2019-11-20 NOTE — Telephone Encounter (Signed)
Sent to pharmacy 

## 2019-11-23 ENCOUNTER — Encounter: Payer: Self-pay | Admitting: Physical Therapy

## 2019-11-23 ENCOUNTER — Other Ambulatory Visit: Payer: Self-pay

## 2019-11-23 ENCOUNTER — Ambulatory Visit: Payer: Managed Care, Other (non HMO) | Admitting: Physical Therapy

## 2019-11-23 DIAGNOSIS — M546 Pain in thoracic spine: Secondary | ICD-10-CM

## 2019-11-23 DIAGNOSIS — M6281 Muscle weakness (generalized): Secondary | ICD-10-CM

## 2019-11-23 DIAGNOSIS — M542 Cervicalgia: Secondary | ICD-10-CM

## 2019-11-23 DIAGNOSIS — M545 Low back pain, unspecified: Secondary | ICD-10-CM

## 2019-11-23 DIAGNOSIS — G8929 Other chronic pain: Secondary | ICD-10-CM

## 2019-11-23 NOTE — Therapy (Signed)
[facility] [address], [place], [address] Phone: [phone]   Fax:  [phone]  Physical Therapy Treatment  Patient Details  Name: Casey Colon MRN: [id number] Date of Birth: 1971/09/20 Referring Provider (PT):  Marla Roe Loel Lofty, DO   Encounter Date: 11/23/2019  PT End of Session - 11/23/19 2015    Visit Number  3    Number of Visits  12    Date for PT Re-Evaluation  11/30/19    Authorization Type  [employer] MANAGED reporting from 10/19/2019    Authorization - Visit Number  3    Authorization - Number of Visits  10    PT Start Time  1030    PT Stop Time  1110    PT Time Calculation (min)  40 min    Activity Tolerance  Patient tolerated treatment well    Behavior During Therapy  [facility] for tasks assessed/performed       Past Medical History:  Diagnosis Date  . Chronic headaches   . Edema   . GERD (gastroesophageal reflux disease)   . IBS (irritable bowel syndrome)     Past Surgical History:  Procedure Laterality Date  . LAPAROSCOPIC VAGINAL HYSTERECTOMY WITH SALPINGECTOMY Bilateral 05/09/2018   Procedure: LAPAROSCOPIC ASSISTED VAGINAL HYSTERECTOMY WITH BILATERAL  SALPINGECTOMY;  Surgeon: [name], MD;  Location: [facility] ORS;  Service: Gynecology;  Laterality: Bilateral;  . NO PAST SURGERIES      There were no vitals filed for this visit.  Subjective Assessment - 11/23/19 1034    Subjective  Patient reports she has no pain upon arrival today. Reports she was sore like using new muscles last treatment session but no significant change better or worse in her concordant pain. The last time she had pain was yesterday at work at the R lumbar region. She reports she has not doing her HEP much. She has not been home much and is always running at work. She feels like needing to get on the floor is a barrier to exercise because of knee pain. She has tried leg lifts on the bed but it is soft. They  have started doing a squat challenge at work and it has been going well with modified squats. She also has a kettlebell that is 5# that she holds while she does the squat.    Pertinent History  Patient has PMH include: irritable bowel syndrome, chronic low back pain, and history of laparoscopic vaginal histerectomy with salpingectomy.    Limitations  Walking;Writing;Standing;House hold activities    How long can you sit comfortably?  15 min    How long can you stand comfortably?  15 min    How long can you walk comfortably?  30 min    Diagnostic tests  X-ray: Lower lumbar osteoarthritic change, primarily at L5-S1. No fractureor spondylolisthesis.    Patient Stated Goals  Reduce the pain at her lower back    Currently in Pain?  No/denies    Pain Onset  Today    Pain Onset  More than a month ago       TREATMENT: Denies sensitivity to latex  Therapeutic exercise:to centralize symptoms and improve ROM, strength, muscular endurance, and activity tolerance required for successful completion of functional activities. - Treadmill 2 mph at 0% grade. For improved lower extremity mobility, muscular endurance, and weightbearing activity tolerance; and to induce the analgesic effect of aerobic exercise, stimulate improved joint nutrition, and prepare body structures and  systems for following interventions. x5.5  Minutes during subjective exam.   - isometric glute quat with arms overhead in Y and green theraband around knee pressing outwards and concentrating on squeezing glutes throughout exercise. 30 seconds isometric hold and 30 seconds rest x 10.   - Standing pallof press (multifidus press) with double green theraband 3x15each side. Cuing for trunk control.   - modified push up on counter top 3x15 with cuing for improved core stability.    - Side stepping with green theraband wrapped around legs and held at umbilicus to activate core and hip stabilizers and abductors in functional weightbearing  position. Cuing to activate abdominals. Stepping a few steps out and in as allowed by band 2x42min. Instructed to not squat more due to discomfort at knees.   - Education on HEP including handout   - Pt required multimodal cuing for proper technique and to facilitate improved neuromuscular control, strength, range of motion, and functional ability resulting in improved performance and form. Patient required rest breaks between sets.   HOME EXERCISE PROGRAM Access Code: [email or web address]  URL: [email or web address]  Date: 11/23/2019  Prepared by: Rosita Kea   Exercises Overhead Y Squat - 10 reps - 30 hold - 30 second Rest - 1x daily Squatting Anti-Rotation Press - 3 sets - 15 reps - 1 every other day   PT Education - 11/23/19 2010    Education Details  Exercise purpose/form. Self management techniques.    Person(s) Educated  Patient    Methods  Explanation;Demonstration;Tactile cues;Verbal cues;Handout    Comprehension  Verbalized understanding;Returned demonstration;Verbal cues required;Tactile cues required;Need further instruction       PT Short Term Goals - 11/15/19 1118      PT SHORT TERM GOAL #1   Title  Be independent with initial home exercise program for self-management of symptoms.    Baseline  initial HEP provided at IE (10/18/2019);    Time  2    Period  Weeks    Status  On-going    Target Date  11/02/19        PT Long Term Goals - 10/19/19 1634      PT LONG TERM GOAL #1   Title  Be independent with a long-term home exercise program for self-management of symptoms.    Baseline  initial HEP provided at IE (10/18/2019);    Time  6    Period  Weeks    Status  New    Target Date  11/30/19      PT LONG TERM GOAL #2   Title  Demonstrate improve cervical lateral flexion at least 15 degrees to improve ROM decrease stifness and pain control.    Baseline  R:25/L:20 degrees Cervical Lateral Flexion(10/18/2019);    Time  6    Period  Weeks    Status  New     Target Date  11/30/19      PT LONG TERM GOAL #3   Title  Complete community, work and/or recreational activities without limitation due to current condition.    Baseline  prolonged sitting/walking/standing; typing in front of computer at work; lifting patients(10/18/2019);    Time  6    Period  Weeks    Status  New    Target Date  11/30/19      PT LONG TERM GOAL #4   Title  Reduce pain with functional activities to equal or less than 1/10 to allow patient to complete usual activities including ADLs, IADLs, and social engagement with less  difficulty.    Baseline  2/10 at lower back and 7/10 burning feeling at neck while working in front of computer. (10/18/2019)    Time  6    Period  Weeks    Status  New    Target Date  11/30/19      PT LONG TERM GOAL #5   Title  Demonstrate reduce mODI score by 10 units to demonstrate improvement in overall condition and self-reported functional ability.    Baseline  38% (10/18/2019);    Time  6    Period  Weeks    Status  New    Target Date  11/30/19            Plan - 11/23/19 2014    Clinical Impression Statement  Patient tolerated treatment well overall with some mild discomfort in knees during side stepping that decreased with rest. Today's session focused on core, glute, and LE strengthening in upright position with update in HEP to include exersises in standing to improve her ability to do them throughout the day (not lying down). Patient was appropriately challenged and required rest breaks. Patient would benefit from continued physical therapy to address remaining impairments and functional limitations to work towards stated goals and return to PLOF or maximal functional independence.    Personal Factors and Comorbidities  Past/Current Experience;Time since onset of injury/illness/exacerbation;Comorbidity 2    Comorbidities  irritable bowel syndrome, chronic low back pain, and history of laparoscopic vaginal histerectomy with  salpingectomy.    Examination-Activity Limitations  Bathing;Bed Mobility;Dressing;Hygiene/Grooming;Bend;Lift;Sleep;Sit;Carry;Stand   push, pull   Examination-Participation Restrictions  Other;Laundry   typing and work on computers at home, handling patients at work   Stability/Clinical Decision Making  Evolving/Moderate complexity    Rehab Potential  Good    PT Frequency  2x / week    PT Duration  6 weeks    PT Treatment/Interventions  ADLs/Self Care Home Management;Electrical Stimulation;Dry needling;Joint Manipulations;Spinal Manipulations;Manual techniques;Neuromuscular re-education;Therapeutic exercise;Therapeutic activities;Functional mobility training;Cryotherapy;Moist Heat;Traction;Ultrasound;Patient/family education;Passive range of motion    PT Next Visit Plan  Stretching, strengthening, postural training    PT Home Exercise Plan  Medbridge [id number]    Consulted and Agree with Plan of Care  Patient       Patient will benefit from skilled therapeutic intervention in order to improve the following deficits and impairments:  Decreased activity tolerance, Decreased endurance, Decreased range of motion, Decreased strength, Pain, Impaired flexibility, Impaired perceived functional ability, Improper body mechanics, Postural dysfunction  Visit Diagnosis: Cervicalgia  Muscle weakness (generalized)  Pain in thoracic spine  Chronic bilateral low back pain without sciatica     Problem List Patient Active Problem List   Diagnosis Date Noted  . Back pain 10/06/2019  . Neck pain 10/06/2019  . Left axillary pain 08/17/2019  . Anxiety 08/17/2019  . Symptomatic mammary hypertrophy 08/17/2019  . Chronic right-sided low back pain without sciatica 05/03/2019  . Status post LAVH bilateral salpingectomy 05/09/2018  . Climacteric 04/06/2018  . Irregular menses 03/01/2018  . Chronic pain of right knee 03/01/2018  . Encounter for general adult medical examination with abnormal findings  02/26/2017  . GERD (gastroesophageal reflux disease) 01/06/2017  . Vitamin D deficiency 01/06/2017  . Obesity 01/06/2017  . IBS (irritable bowel syndrome) 01/06/2017  . Bilateral lower extremity edema 01/06/2017    Everlean Alstrom. Graylon Good, PT, DPT 11/23/19, 8:16 PM  [facility] [address], [place], [address] Phone: [phone]   Fax:  [phone]  Name: Casey Colon MRN: [id number] Date of Birth: July 15, 1971

## 2019-11-28 ENCOUNTER — Ambulatory Visit: Payer: Managed Care, Other (non HMO) | Admitting: Physical Therapy

## 2019-12-06 ENCOUNTER — Ambulatory Visit: Payer: Managed Care, Other (non HMO) | Admitting: Physical Therapy

## 2019-12-06 ENCOUNTER — Other Ambulatory Visit: Payer: Self-pay

## 2019-12-06 ENCOUNTER — Encounter: Payer: Self-pay | Admitting: Physical Therapy

## 2019-12-06 DIAGNOSIS — M542 Cervicalgia: Secondary | ICD-10-CM | POA: Diagnosis not present

## 2019-12-06 DIAGNOSIS — M545 Low back pain, unspecified: Secondary | ICD-10-CM

## 2019-12-06 DIAGNOSIS — G8929 Other chronic pain: Secondary | ICD-10-CM

## 2019-12-06 DIAGNOSIS — M546 Pain in thoracic spine: Secondary | ICD-10-CM

## 2019-12-06 DIAGNOSIS — M6281 Muscle weakness (generalized): Secondary | ICD-10-CM

## 2019-12-06 NOTE — Therapy (Signed)
[facility] [address], [place], [address] Phone: [phone]   Fax:  [phone]  Physical Therapy Treatment / Progress Note / Re-Certification Reporting period: 10/18/2019 - 12/06/2019  Patient Details  Name: Casey Colon MRN: [id number] Date of Birth: 03-28-1971 Referring Provider (PT):  Wallace Going, DO   Encounter Date: 12/06/2019  PT End of Session - 12/06/19 1117    Visit Number  4    Number of Visits  12    Date for PT Re-Evaluation  01/17/20    Authorization Type  [employer] MANAGED reporting from 10/19/2019 (new reporting period from 12/06/2019);    Authorization - Visit Number  4    Authorization - Number of Visits  10    PT Start Time  1115    PT Stop Time  1200    PT Time Calculation (min)  45 min    Activity Tolerance  Patient tolerated treatment well    Behavior During Therapy  WFL for tasks assessed/performed       Past Medical History:  Diagnosis Date  . Chronic headaches   . Edema   . GERD (gastroesophageal reflux disease)   . IBS (irritable bowel syndrome)     Past Surgical History:  Procedure Laterality Date  . LAPAROSCOPIC VAGINAL HYSTERECTOMY WITH SALPINGECTOMY Bilateral 05/09/2018   Procedure: LAPAROSCOPIC ASSISTED VAGINAL HYSTERECTOMY WITH BILATERAL  SALPINGECTOMY;  Surgeon: [name], MD;  Location: [facility] ORS;  Service: Gynecology;  Laterality: Bilateral;  . NO PAST SURGERIES      There were no vitals filed for this visit.  Subjective Assessment - 12/06/19 1119    Subjective  Patient states "I feel OK right now" upon arrival. States she was hurting a little bit yesterday in the left side of her back. States she has been really busy and work has been keeping her from attending PT regularrly. Her work scheudle changes every few days so it is hard to schedule. States she felt achy and the left and R side of back was aching. Did not provide numeric rating  despite questioning more than once. Patient reports she has been doing squats at home but not the other exercises from her HEP. She has been doing a lot of walking. Her upper back and neck has not been bothering her much because she hasn't needed to do a lot of computer work lately. It is mostly her lower back that bothers her. She states she feels she has the same problems and has not had any improvement overall in her back since starting physical therapy. She also continues to have pain in the right knee. She feels like the treatment helps some when she comes in but overall is not improving with physical therapy.    Pertinent History  Patient has PMH include: irritable bowel syndrome, chronic low back pain, and history of laparoscopic vaginal histerectomy with salpingectomy.    Limitations  Walking;Writing;Standing;House hold activities    How long can you sit comfortably?  15 min    How long can you stand comfortably?  15 min    How long can you walk comfortably?  30 min; can get 2.5 miles but it aches    Diagnostic tests  X-ray: Lower lumbar osteoarthritic change, primarily at L5-S1. No fractureor spondylolisthesis.    Patient Stated Goals  Reduce the pain at her lower back    Currently in Pain?  No/denies    Pain Onset  More than a  month ago    Effect of Pain on Daily Activities  tying shoes, sleeping on back, sitting or standing too long (affects work), lifting residents at work, lifting    Pain Score  --   up to 7/10 in the last two weeks   Pain Onset  More than a month ago        [facility] PT Assessment - 12/06/19 0001      Assessment   Medical Diagnosis  Symptomatic mammary hypertrophy, Chronic bilateral thoracic back pain, and Neck pain    Referring Provider (PT)   Dillingham, Loel Lofty, DO    Hand Dominance  Right    Next MD Visit  12/15/2019    Prior Therapy  No       Precautions   Precautions  None      [place] residence    Living  Arrangements  Spouse/significant other    Available Help at Discharge  Family      Prior Function   Level of Independence  Independent    Vocation Requirements  Nursing      Cognition   Overall Cognitive Status  Within Functional Limits for tasks assessed      Observation/Other Assessments   Observations  see note from 12/06/2019 for latest objective data    Other Surveys   Oswestry Disability Index    Oswestry Disability Index   mODI = 32%        OBJECTIVE  OBSERVATION Large breasts with compression of shoulders where bra-straps rest.   Posture Forward neck and protracted shoulder during sitting.   Palpation No pain at posterior neck, but pain during UPA on R lower back at T10 to L5.   Strength R/L 5/5 Shoulder flexion 5/5Shoulder abduction  5/5 Shoulder external rotation  5/5 Shoulder internal rotation 5/5 Shoulder extension  5/5 Elbow flexion 5/5 Elbow extension  Hip extension (knee flexed to bias glute): B = very poor glute activation and limited ROM. Improved with practice.    AROM R/L Cervical Flexion: no pain, WFL, lack of cervical flexion.  Cervical Extension: [facility] 30/30 Cervical Lateral Flexion Cervical Rotation: [facility]  Lumbar flexion: WFL no pain Lumbar extension: 75% restricted. pain full at end range*.  *Indicates pain  Marcello Moores test: shortness in iliopsoas and rectus femoris bilaterally.   TREATMENT: Denies sensitivity to latex  Therapeutic exercise:to centralize symptoms and improve ROM, strength, muscular endurance, and activity tolerance required for successful completion of functional activities. - examination to assess progress (including 5 min unbilled to fill out mODI) - isometric glute quat with arms overhead in Y and blue theraband around knee pressing outwards and concentrating on squeezing glutes throughout exercise. 30 seconds isometric hold and 30 seconds rest x 5 - hooklying glute bridge x5 (reports back pain and difficulty  lifting buttocks off plinth). - prone hip extension with knee flexed to bias glutes, x10 each side (very inhibited).  - supine hip flexor stretch 2x30 seconds each side.  - Education on diagnosis, prognosis, POC, anatomy and physiology of current condition.  - Education on HEP including handout   - Pt required multimodal cuing for proper technique and to facilitate improved neuromuscular control, strength, range of motion, and functional ability resulting in improved performance and form. Discussed importance of attendance as well as strategies for improving attendance.   HOME EXERCISE PROGRAM Access Code: [email or web address]  URL: [email or web address]  Date: 12/06/2019  Prepared by: [place]  reps - 30 hold - 30 second Rest - 1x daily Squatting Anti-Rotation Press - 3 sets - 15 reps - 1 every other day Hip Flexor Stretch at [facility] of Bed - 3 reps - 30 seconds hold - 1x daily     PT Education - 12/06/19 1117    Education Details  Exercise purpose/form. Self management techniques. POC    Person(s) Educated  Patient    Methods  Explanation;Demonstration;Tactile cues;Verbal cues    Comprehension  Verbalized understanding;Returned demonstration;Verbal cues required;Tactile cues required       PT Short Term Goals - 12/06/19 1130      PT SHORT TERM GOAL #1   Title  Be independent with initial home exercise program for self-management of symptoms.    Baseline  initial HEP provided at IE (10/18/2019); performs squats and walking (21/30/2020);    Time  2    Period  Weeks    Status  On-going    Target Date  11/02/19        PT Long Term Goals - 12/06/19 1130      PT LONG TERM GOAL #1   Title  Be independent with a long-term home exercise program for self-management of symptoms.    Baseline  initial HEP provided at IE (10/18/2019); completeing squats and walking (12/06/2019);    Time  6    Period  Weeks    Status  Partially Met    Target  Date  01/17/20      PT LONG TERM GOAL #2   Title  Demonstrate improve cervical lateral flexion at least 15 degrees to improve ROM decrease stifness and pain control.    Baseline  R:25/L:20 degrees Cervical Lateral Flexion(10/18/2019);    Time  6    Period  Weeks    Status  Achieved    Target Date  11/30/19      PT LONG TERM GOAL #3   Title  Complete community, work and/or recreational activities without limitation due to current condition.    Baseline  prolonged sitting/walking/standing; typing in front of computer at work; lifting patients(10/18/2019); can go up stairs easier - no changes otherwise (12/06/2019);    Time  6    Period  Weeks    Status  On-going    Target Date  01/17/20      PT LONG TERM GOAL #4   Title  Reduce pain with functional activities to equal or less than 1/10 to allow patient to complete usual activities including ADLs, IADLs, and social engagement with less difficulty.    Baseline  2/10 at lower back and 7/10 burning feeling at neck while working in front of computer. (10/18/2019); neck continues to burn while working at computer; low back up to 7/10 (12/06/2019);    Time  6    Period  Weeks    Status  On-going    Target Date  01/17/20      PT LONG TERM GOAL #5   Title  Demonstrate reduce mODI score by 10 units to demonstrate improvement in overall condition and self-reported functional ability.    Baseline  36% (10/18/2019); 32% (12/06/2019);    Time  6    Period  Weeks    Status  On-going    Target Date  01/17/20            Plan - 12/06/19 1737    Clinical Impression Statement  Patient has attended 4 physical therapy sessions since 10/18/2019 and has made minimal progress towards goals.  Patient works in a long term care facility and has difficulty scheduling and attending physical therapy appointments due to unexpected quaranteens due to [place] problems at work, short staffing at work, and frequently changing schedule. She has been unsuccessful  with physical therapy up to this point due to barriers to attendance that are largely out of her control. Discussed strategies to help her be able to attend at least one time a week and recommend extending plan of care for another 6 weeks to allow her to work with these suggestions to see if she can make an improvement. Continues to have impairments such as deficits in strength, mobility, weakness, and limited cervical ROM. These deficits limit the patient ability to perform things such as ADLs, IADLs, social participation, caring for others, engaging in hobbies (playing sports), and impairs their quality of life. The pt will benefit from skilled PT services to address deficits and return to PLOF and independence, recreational activity and work.    Personal Factors and Comorbidities  Past/Current Experience;Time since onset of injury/illness/exacerbation;Comorbidity 2    Comorbidities  irritable bowel syndrome, chronic low back pain, and history of laparoscopic vaginal histerectomy with salpingectomy.    Examination-Activity Limitations  Bathing;Bed Mobility;Dressing;Hygiene/Grooming;Bend;Lift;Sleep;Sit;Carry;Stand   push, pull   Examination-Participation Restrictions  Other;Laundry   typing and work on computers at home, handling patients at work   Stability/Clinical Decision Making  Evolving/Moderate complexity    Rehab Potential  Good    PT Frequency  2x / week    PT Duration  6 weeks    PT Treatment/Interventions  ADLs/Self Care Home Management;Electrical Stimulation;Dry needling;Joint Manipulations;Spinal Manipulations;Manual techniques;Neuromuscular re-education;Therapeutic exercise;Therapeutic activities;Functional mobility training;Cryotherapy;Moist Heat;Traction;Ultrasound;Patient/family education;Passive range of motion    PT Next Visit Plan  Stretching, strengthening, postural training    PT Home Exercise Plan  Medbridge [email or web address]    Consulted and Agree with Plan of Care  Patient        Patient will benefit from skilled therapeutic intervention in order to improve the following deficits and impairments:  Decreased activity tolerance, Decreased endurance, Decreased range of motion, Decreased strength, Pain, Impaired flexibility, Impaired perceived functional ability, Improper body mechanics, Postural dysfunction  Visit Diagnosis: Cervicalgia  Muscle weakness (generalized)  Pain in thoracic spine  Chronic bilateral low back pain without sciatica     Problem List Patient Active Problem List   Diagnosis Date Noted  . Back pain 10/06/2019  . Neck pain 10/06/2019  . Left axillary pain 08/17/2019  . Anxiety 08/17/2019  . Symptomatic mammary hypertrophy 08/17/2019  . Chronic right-sided low back pain without sciatica 05/03/2019  . Status post LAVH bilateral salpingectomy 05/09/2018  . Climacteric 04/06/2018  . Irregular menses 03/01/2018  . Chronic pain of right knee 03/01/2018  . Encounter for general adult medical examination with abnormal findings 02/26/2017  . GERD (gastroesophageal reflux disease) 01/06/2017  . Vitamin D deficiency 01/06/2017  . Obesity 01/06/2017  . IBS (irritable bowel syndrome) 01/06/2017  . Bilateral lower extremity edema 01/06/2017   Everlean Alstrom. Graylon Good, PT, DPT 12/06/19, 5:40 PM  [facility] [address] [place], [address] Phone: [phone]   Fax:  [phone]  Name: Casey Colon MRN: [id number] Date of Birth: 1970/12/08

## 2019-12-13 ENCOUNTER — Encounter: Payer: Managed Care, Other (non HMO) | Admitting: Physical Therapy

## 2019-12-14 ENCOUNTER — Ambulatory Visit: Payer: Managed Care, Other (non HMO) | Admitting: Physical Therapy

## 2019-12-15 ENCOUNTER — Ambulatory Visit: Payer: Managed Care, Other (non HMO) | Admitting: Plastic Surgery

## 2019-12-20 ENCOUNTER — Encounter: Payer: Managed Care, Other (non HMO) | Admitting: Physical Therapy

## 2019-12-29 ENCOUNTER — Ambulatory Visit: Payer: Managed Care, Other (non HMO) | Admitting: Plastic Surgery

## 2020-01-03 ENCOUNTER — Ambulatory Visit: Payer: Managed Care, Other (non HMO) | Admitting: Physical Therapy

## 2020-01-18 ENCOUNTER — Encounter: Payer: Self-pay | Admitting: Physical Therapy

## 2020-01-18 DIAGNOSIS — M545 Low back pain, unspecified: Secondary | ICD-10-CM

## 2020-01-18 DIAGNOSIS — M546 Pain in thoracic spine: Secondary | ICD-10-CM

## 2020-01-18 DIAGNOSIS — M6281 Muscle weakness (generalized): Secondary | ICD-10-CM

## 2020-01-18 DIAGNOSIS — M542 Cervicalgia: Secondary | ICD-10-CM

## 2020-01-18 DIAGNOSIS — G8929 Other chronic pain: Secondary | ICD-10-CM

## 2020-01-18 NOTE — Therapy (Signed)
[facility] [address], [place], [address] Phone: [phone]   Fax:  [phone]  Physical Therapy No-Visit Discharge Summary Reporting period: 10/18/2019 - 01/18/2020  Patient Details  Name: Casey Colon MRN: [id number] Date of Birth: [birth year] Referring Provider (PT):  Wallace Going, DO   Encounter Date: 01/18/2020    Past Medical History:  Diagnosis Date  . Chronic headaches   . Edema   . GERD (gastroesophageal reflux disease)   . IBS (irritable bowel syndrome)     Past Surgical History:  Procedure Laterality Date  . LAPAROSCOPIC VAGINAL HYSTERECTOMY WITH SALPINGECTOMY Bilateral 05/09/2018   Procedure: LAPAROSCOPIC ASSISTED VAGINAL HYSTERECTOMY WITH BILATERAL  SALPINGECTOMY;  Surgeon: [name], MD;  Location: [facility] ORS;  Service: Gynecology;  Laterality: Bilateral;  . NO PAST SURGERIES      There were no vitals filed for this visit.  Subjective Assessment - 01/18/20 1201    Subjective  Patient did not return for physical therapy following her last appointment 66/29/4765 and her certification period has now expired.    Pertinent History  Patient has PMH include: irritable bowel syndrome, chronic low back pain, and history of laparoscopic vaginal histerectomy with salpingectomy.    Limitations  Walking;Writing;Standing;House hold activities    How long can you sit comfortably?  15 min    How long can you stand comfortably?  15 min    How long can you walk comfortably?  30 min; can get 2.5 miles but it aches    Diagnostic tests  X-ray: Lower lumbar osteoarthritic change, primarily at L5-S1. No fractureor spondylolisthesis.    Patient Stated Goals  Reduce the pain at her lower back    Pain Onset  More than a month ago    Pain Onset  More than a month ago       OBJECTIVE Patient is not present for examination at this time. Please see previous documentation for latest objective  data.     PT Short Term Goals - 01/18/20 1203      PT SHORT TERM GOAL #1   Title  Be independent with initial home exercise program for self-management of symptoms.    Baseline  initial HEP provided at IE (10/18/2019); performs squats and walking (21/30/2020);    Time  2    Period  Weeks    Status  Not Met    Target Date  11/02/19        PT Long Term Goals - 01/18/20 1203      PT LONG TERM GOAL #1   Title  Be independent with a long-term home exercise program for self-management of symptoms.    Baseline  initial HEP provided at IE (10/18/2019); completeing squats and walking (12/06/2019);    Time  6    Period  Weeks    Status  Not Met    Target Date  01/17/20      PT LONG TERM GOAL #2   Title  Demonstrate improve cervical lateral flexion at least 15 degrees to improve ROM decrease stifness and pain control.    Baseline  R:25/L:20 degrees Cervical Lateral Flexion(10/18/2019);    Time  6    Period  Weeks    Status  Achieved    Target Date  11/30/19      PT LONG TERM GOAL #3   Title  Complete community, work and/or recreational activities without limitation due to current condition.    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    Baseline  2/10 at lower back and 7/10 burning feeling at neck while working in front of computer. (10/18/2019); neck continues to burn while working at computer; low back up to 7/10 (12/06/2019);    Time  6    Period  Weeks    Status  Not Met    Target Date  01/17/20      PT LONG TERM GOAL #5   Title  Demonstrate reduce mODI score  by 10 units to demonstrate improvement in overall condition and self-reported functional ability.    Baseline  36% (10/18/2019); 32% (12/06/2019);    Time  6    Period  Weeks    Status  Not Met    Target Date  01/17/20            Plan - 01/18/20 1205    Clinical Impression Statement  Patient attended 4 physical therapy treatment sessions this episode of care and was unable to progress towards goals due to poor ability to attend relating to unpredictable work schedule. She is now discharged from physical therapy for this reason.    Personal Factors and Comorbidities  Past/Current Experience;Time since onset of injury/illness/exacerbation;Comorbidity 2    Comorbidities  irritable bowel syndrome, chronic low back pain, and history of laparoscopic vaginal histerectomy with salpingectomy.    Examination-Activity Limitations  Bathing;Bed Mobility;Dressing;Hygiene/Grooming;Bend;Lift;Sleep;Sit;Carry;Stand   push, pull   Examination-Participation Restrictions  Other;Laundry   typing and work on computers at home, handling patients at work   Stability/Clinical Decision Making  Evolving/Moderate complexity    Rehab Potential  Good    PT Frequency  2x / week    PT Duration  6 weeks    PT Treatment/Interventions  ADLs/Self Care Home Management;Electrical Stimulation;Dry needling;Joint Manipulations;Spinal Manipulations;Manual techniques;Neuromuscular re-education;Therapeutic exercise;Therapeutic activities;Functional mobility training;Cryotherapy;Moist Heat;Traction;Ultrasound;Patient/family education;Passive range of motion    PT Next Visit Plan  Patient is now discharged from physical therapy due to lack of ability to attend regularly or make progress    PT [place] and Agree with Plan of Care  Patient       Patient will benefit from skilled therapeutic intervention in order to improve the following deficits and impairments:  Decreased activity  tolerance, Decreased endurance, Decreased range of motion, Decreased strength, Pain, Impaired flexibility, Impaired perceived functional ability, Improper body mechanics, Postural dysfunction  Visit Diagnosis: Cervicalgia  Muscle weakness (generalized)  Pain in thoracic spine  Chronic bilateral low back pain without sciatica     Problem List Patient Active Problem List   Diagnosis Date Noted  . Back pain 10/06/2019  . Neck pain 10/06/2019  . Left axillary pain 08/17/2019  . Anxiety 08/17/2019  . Symptomatic mammary hypertrophy 08/17/2019  . Chronic right-sided low back pain without sciatica 05/03/2019  . Status post LAVH bilateral salpingectomy 05/09/2018  . Climacteric 04/06/2018  . Irregular menses 03/01/2018  . Chronic pain of right knee 03/01/2018  . Encounter for general adult medical examination  with abnormal findings 02/26/2017  . GERD (gastroesophageal reflux disease) 01/06/2017  . Vitamin D deficiency 01/06/2017  . Obesity 01/06/2017  . IBS (irritable bowel syndrome) 01/06/2017  . Bilateral lower extremity edema 01/06/2017    Everlean Alstrom. Graylon Good, PT, DPT 01/18/20, 12:05 PM  [facility] [address], [place], [address] Phone: [phone]   Fax:  [phone]  Name: BLOSSIE RAFFEL MRN: [id number] Date of Birth: 1970-12-13

## 2020-02-02 ENCOUNTER — Ambulatory Visit: Payer: Managed Care, Other (non HMO) | Admitting: Plastic Surgery

## 2020-02-02 ENCOUNTER — Other Ambulatory Visit: Payer: Self-pay

## 2020-02-02 ENCOUNTER — Ambulatory Visit (INDEPENDENT_AMBULATORY_CARE_PROVIDER_SITE_OTHER): Payer: Managed Care, Other (non HMO) | Admitting: Plastic Surgery

## 2020-02-02 ENCOUNTER — Encounter: Payer: Self-pay | Admitting: Plastic Surgery

## 2020-02-02 VITALS — BP 122/86 | HR 86 | Temp 96.8°F | Ht 66.0 in | Wt 257.4 lb

## 2020-02-02 DIAGNOSIS — G8929 Other chronic pain: Secondary | ICD-10-CM

## 2020-02-02 DIAGNOSIS — N62 Hypertrophy of breast: Secondary | ICD-10-CM | POA: Diagnosis not present

## 2020-02-02 DIAGNOSIS — M542 Cervicalgia: Secondary | ICD-10-CM

## 2020-02-02 DIAGNOSIS — M546 Pain in thoracic spine: Secondary | ICD-10-CM | POA: Diagnosis not present

## 2020-02-02 NOTE — Progress Notes (Signed)
Patient ID: Casey Colon, female    DOB: Sep 02, 1971, 49 y.o.   MRN: [id number]   Chief Complaint  Patient presents with  . Follow-up    reassess (B) breast reduction  . Breast Problem    Mammary Hyperplasia: The patient is a 49 y.o. female with a history of mammary hyperplasia for several years.  She has extremely large breasts causing symptoms that include the following: Back pain in the upper and lower back, including neck pain. She pulls or pins her bra straps to provide better lift and relief of the pressure and pain. She notices relief by holding her breast up manually.  Her shoulder straps cause grooves and pain and pressure that requires padding for relief. Pain medication is sometimes required with motrin and tylenol.  Activities that are hindered by enlarged breasts include: exercise and running.  She completed physical therapy.  She found that it was helpful while she did it but as soon as she was finished she had the neck and back pain again.  Her breasts are extremely large and fairly symmetric.  She has hyperpigmentation of the inframammary area on both sides.  The sternal to nipple distance on the right is 40 cm and the left is 40 cm.  The IMF distance is 18 cm.  She is 5 feet 6 inches tall and weighs 257 pounds.  Preoperative bra size = 40 DD cup.  She would like to be a B if possible. The estimated excess breast tissue to be removed at the time of surgery = 950 grams on the left and 950 grams on the right.  Mammogram history: She had a mammogram.  She denies any tobacco use.   Review of Systems  Constitutional: Positive for activity change. Negative for appetite change.  HENT: Negative.   Eyes: Negative.   Respiratory: Negative.  Negative for chest tightness and shortness of breath.   Cardiovascular: Positive for leg swelling.  Gastrointestinal: Negative.   Endocrine: Negative.   Genitourinary: Negative.   Musculoskeletal: Positive for back pain and neck pain.  Skin:  Negative for color change and wound.  Hematological: Negative.   Psychiatric/Behavioral: Negative.     Past Medical History:  Diagnosis Date  . Chronic headaches   . Edema   . GERD (gastroesophageal reflux disease)   . IBS (irritable bowel syndrome)     Past Surgical History:  Procedure Laterality Date  . LAPAROSCOPIC VAGINAL HYSTERECTOMY WITH SALPINGECTOMY Bilateral 05/09/2018   Procedure: LAPAROSCOPIC ASSISTED VAGINAL HYSTERECTOMY WITH BILATERAL  SALPINGECTOMY;  Surgeon: [name], MD;  Location: [facility] ORS;  Service: Gynecology;  Laterality: Bilateral;  . NO PAST SURGERIES        Current Outpatient Medications:  .  aspirin-acetaminophen-caffeine (EXCEDRIN MIGRAINE) 250-250-65 MG tablet, Take 1 tablet by mouth every 6 (six) hours as needed for headache., Disp: , Rfl:  .  calcium carbonate (TUMS - DOSED IN MG ELEMENTAL CALCIUM) 500 MG chewable tablet, Chew 1-2 tablets by mouth 2 (two) times daily as needed (for heartburn/indigestion.). , Disp: , Rfl:  .  Cholecalciferol (VITAMIN D3) 2000 units TABS, Take 2,000 Units by mouth daily., Disp: , Rfl:  .  famotidine (PEPCID) 20 MG tablet, Take 20 mg by mouth 2 (two) times daily., Disp: , Rfl:  .  fexofenadine (ALLEGRA) 180 MG tablet, Take 180 mg by mouth daily as needed for allergies., Disp: , Rfl:  .  hydrOXYzine (VISTARIL) 25 MG capsule, Take 1 capsule (25 mg total) by mouth 3 (  three) times daily as needed., Disp: 30 capsule, Rfl: 0 .  meloxicam (MOBIC) 7.5 MG tablet, TAKE 1 TABLET(7.5 MG) BY MOUTH DAILY, Disp: 30 tablet, Rfl: 1 .  ondansetron (ZOFRAN ODT) 4 MG disintegrating tablet, Take 1 tablet (4 mg total) by mouth every 6 (six) hours as needed for nausea., Disp: 20 tablet, Rfl: 0 .  ranitidine (ZANTAC) 150 MG tablet, TAKE 1 TABLET(150 MG) BY MOUTH TWICE DAILY, Disp: 180 tablet, Rfl: 1 .  triamterene-hydrochlorothiazide (MAXZIDE-25) 37.5-25 MG tablet, Take 1 tablet by mouth daily., Disp: 90 tablet, Rfl: 1   Objective:    Vitals:   02/02/20 0847  BP: 122/86  Pulse: 86  Temp: (!) 96.8 F (36 C)  SpO2: 97%    Physical Exam Vitals and nursing note reviewed.  Constitutional:      Appearance: Normal appearance.  HENT:     Head: Normocephalic and atraumatic.  Eyes:     Extraocular Movements: Extraocular movements intact.  Cardiovascular:     Rate and Rhythm: Normal rate.     Pulses: Normal pulses.  Pulmonary:     Effort: Pulmonary effort is normal.  Abdominal:     General: Abdomen is flat. There is no distension.     Tenderness: There is no abdominal tenderness.  Skin:    General: Skin is warm.     Capillary Refill: Capillary refill takes less than 2 seconds.  Neurological:     General: No focal deficit present.     Mental Status: She is alert and oriented to person, place, and time.  Psychiatric:        Mood and Affect: Mood normal.        Behavior: Behavior normal.        Thought Content: Thought content normal.     Assessment & Plan:  Neck pain  Chronic bilateral thoracic back pain  Symptomatic mammary hypertrophy  Recommend bilateral breast reduction liposuction laterally.  Pictures were obtained of the patient and placed in the chart with the patient's or guardian's permission.   Wallace Going, DO   The 21st Century Cures Act was signed into law in 2016 which includes the topic of electronic health records.  This provides immediate access to information in MyChart.  This includes consultation notes, operative notes, office notes, lab results and pathology reports.  If you have any questions about what you read please let us know at your next visit or call us at the office.  We are right here with you.

## 2020-04-03 NOTE — H&P (View-Only) (Signed)
ICD-10-CM   1. Symptomatic mammary hypertrophy  N62       Patient ID: Casey Colon, female    DOB: 04/08/1971, 49 y.o.   MRN: [id number]   History of Present Illness: Casey Colon is a 49 y.o.  female  with a history of symptomatic mammary hypertrophy.  She presents for preoperative evaluation for upcoming procedure, bilateral breast reduction with liposuction, scheduled for 04/24/2020 with Dr. Marla Roe.  Summary from previous visit: Patient's breasts are extremely large but fairly symmetric.  Sternal to nipple distance on the right is 40 cm and the left is 40 cm.  IMF distance is 18 cm.  She is 5 feet 6 inches tall and weighs 257 pounds.  Preoperative bra size equals 40 DD.  She would like to be a B if possible.  Estimated excess breast tissue to be removed at time of surgery is 950 g from each side.  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  The patient has not had problems with anesthesia.   Past Medical History: Allergies: Allergies  Allergen Reactions  . Dilaudid [Hydromorphone Hcl] Other (See Comments)    Chest heaviness/difficulty breathing  . Zoloft [Sertraline] Other (See Comments)    anger    Current Medications:  Current Outpatient Medications:  .  calcium carbonate (TUMS - DOSED IN MG ELEMENTAL CALCIUM) 500 MG chewable tablet, Chew 1-2 tablets by mouth 2 (two) times daily as needed (for heartburn/indigestion.). , Disp: , Rfl:  .  Cholecalciferol (VITAMIN D3) 2000 units TABS, Take 2,000 Units by mouth daily., Disp: , Rfl:  .  famotidine (PEPCID) 20 MG tablet, Take 20 mg by mouth 2 (two) times daily., Disp: , Rfl:  .  fexofenadine (ALLEGRA) 180 MG tablet, Take 180 mg by mouth daily as needed for allergies., Disp: , Rfl:  .  hydrOXYzine (VISTARIL) 25 MG capsule, Take 1 capsule (25 mg total) by mouth 3 (three) times daily as needed., Disp: 30 capsule, Rfl: 0 .  meloxicam (MOBIC) 7.5 MG tablet, TAKE 1 TABLET(7.5 MG) BY  MOUTH DAILY, Disp: 30 tablet, Rfl: 1 .  ondansetron (ZOFRAN ODT) 4 MG disintegrating tablet, Take 1 tablet (4 mg total) by mouth every 6 (six) hours as needed for nausea., Disp: 20 tablet, Rfl: 0 .  ranitidine (ZANTAC) 150 MG tablet, TAKE 1 TABLET(150 MG) BY MOUTH TWICE DAILY, Disp: 180 tablet, Rfl: 1 .  triamterene-hydrochlorothiazide (MAXZIDE-25) 37.5-25 MG tablet, Take 1 tablet by mouth daily., Disp: 90 tablet, Rfl: 1 .  aspirin-acetaminophen-caffeine (EXCEDRIN MIGRAINE) 250-250-65 MG tablet, Take 1 tablet by mouth every 6 (six) hours as needed for headache., Disp: , Rfl:   Past Medical Problems: Past Medical History:  Diagnosis Date  . Chronic headaches   . Edema   . GERD (gastroesophageal reflux disease)   . IBS (irritable bowel syndrome)     Past Surgical History: Past Surgical History:  Procedure Laterality Date  . LAPAROSCOPIC VAGINAL HYSTERECTOMY WITH SALPINGECTOMY Bilateral 05/09/2018   Procedure: LAPAROSCOPIC ASSISTED VAGINAL HYSTERECTOMY WITH BILATERAL  SALPINGECTOMY;  Surgeon: [name], MD;  Location: [facility] ORS;  Service: Gynecology;  Laterality: Bilateral;  . NO PAST SURGERIES      Social History: Social History   Socioeconomic History  . Marital status: Married    Spouse name: Not on file  . Number of children: Not on file  . Years of education: Not on file  . Highest education level: Not on file  Occupational History  . Not on file  Tobacco Use  . Smoking status: Never Smoker  . Smokeless tobacco: Never Used  Substance and Sexual Activity  . Alcohol use: No  . Drug use: No  . Sexual activity: Yes    Comment: same sex  Other Topics Concern  . Not on file  Social History Narrative  . Not on file   Social Determinants of Health   Financial Resource Strain:   . Difficulty of Paying Living Expenses:   Food Insecurity:   . Worried About Charity fundraiser in the Last Year:   . Arboriculturist in the Last Year:   Transportation Needs:   .  Film/video editor (Medical):   Marland Kitchen Lack of Transportation (Non-Medical):   Physical Activity:   . Days of Exercise per Week:   . Minutes of Exercise per Session:   Stress:   . Feeling of Stress :   Social Connections:   . Frequency of Communication with Friends and Family:   . Frequency of Social Gatherings with Friends and Family:   . Attends Religious Services:   . Active Member of Clubs or Organizations:   . Attends Archivist Meetings:   Marland Kitchen Marital Status:   Intimate Partner Violence:   . Fear of Current or Ex-Partner:   . Emotionally Abused:   Marland Kitchen Physically Abused:   . Sexually Abused:     Family History: Family History  Problem Relation Age of Onset  . Asthma Maternal Grandmother   . Ovarian cancer Neg Hx   . Colon cancer Neg Hx     Review of Systems: Review of Systems  Constitutional: Negative for chills and fever.  HENT: Negative for congestion and sore throat.   Respiratory: Negative for cough and shortness of breath.   Cardiovascular: Negative for chest pain and palpitations.  Gastrointestinal: Negative for abdominal pain, nausea and vomiting.  Musculoskeletal: Positive for back pain and neck pain. Negative for joint pain and myalgias.  Skin: Negative for itching and rash.    Physical Exam: Vital Signs BP 134/85 (BP Location: Left Arm, Patient Position: Sitting, Cuff Size: Large)   Pulse 100   Temp 97.8 F (36.6 C) (Temporal)   Ht 5\' 6"  (1.676 m)   Wt 255 lb 3.2 oz (115.8 kg)   LMP 04/20/2018 (Exact Date)   SpO2 100%   BMI 41.19 kg/m  Physical Exam Constitutional:      Appearance: Normal appearance. She is normal weight.  HENT:     Head: Normocephalic and atraumatic.  Eyes:     Extraocular Movements: Extraocular movements intact.  Cardiovascular:     Rate and Rhythm: Normal rate and regular rhythm.     Pulses: Normal pulses.     Heart sounds: Normal heart sounds.  Pulmonary:     Effort: Pulmonary effort is normal.     Breath  sounds: Normal breath sounds. No wheezing, rhonchi or rales.  Abdominal:     General: Bowel sounds are normal.     Palpations: Abdomen is soft.  Musculoskeletal:        General: No swelling. Normal range of motion.     Cervical back: Normal range of motion.  Skin:    General: Skin is warm and dry.     Coloration: Skin is not pale.     Findings: No erythema or rash.  Neurological:     General: No focal deficit present.     Mental Status: She is alert  and oriented to person, place, and time.  Psychiatric:        Mood and Affect: Mood normal.        Behavior: Behavior normal.        Thought Content: Thought content normal.        Judgment: Judgment normal.     Assessment/Plan:  Ms. Paganini scheduled for bilateral breast reduction with Liposuction with Dr. Marla Roe.  Risks, benefits, and alternatives of procedure discussed, questions answered and consent obtained.    Smoking Status: non-smoker Last Mammogram: 07/05/2019; Results: No suspicious findings, no malignancy.  Caprini Score: 4 Moderate; Risk Factors include: 49 year old female, BMI > 25, and length of planned surgery. Recommendation for mechanical or pharmacological prophylaxis during surgery. Encourage early ambulation.   Pictures obtained: 02/02/2020  Post-op Rx sent to pharmacy: Norco, Zofran, Keflex  Patient was provided with breast reduction and general surgical risk consent documents prior to their appointment.  They had adequate time to read through the consent forms and we also discussed them in person together during this preop appointment.  All of their questions were answered to their content.  Recommended calling if they have any further questions.  Consent form to be scanned into patient's chart.  The risk that can be encountered with breast reduction were discussed and include the following but not limited to these:  Breast asymmetry, fluid accumulation, firmness of the breast, inability to breast feed, loss of  nipple or areola, skin loss, decrease or no nipple sensation, fat necrosis of the breast tissue, bleeding, infection, healing delay.  There are risks of anesthesia, changes to skin sensation and injury to nerves or blood vessels.  The muscle can be temporarily or permanently injured.  You may have an allergic reaction to tape, suture, glue, blood products which can result in skin discoloration, swelling, pain, skin lesions, poor healing.  Any of these can lead to the need for revisonal surgery or stage procedures.  A reduction has potential to interfere with diagnostic procedures.  Nipple or breast piercing can increase risks of infection.  This procedure is best done when the breast is fully developed.  Changes in the breast will continue to occur over time.  Pregnancy can alter the outcomes of previous breast reduction surgery, weight gain and weigh loss can also effect the long term appearance.   The [place] was signed into law in 2016 which includes the topic of electronic health records.  This provides immediate access to information in MyChart.  This includes consultation notes, operative notes, office notes, lab results and pathology reports.  If you have any questions about what you read please let us know at your next visit or call us at the office.  We are right here with you.   Electronically signed by: Threasa Heads, PA-C 04/04/2020 10:23 AM

## 2020-04-03 NOTE — Progress Notes (Signed)
ICD-10-CM   1. Symptomatic mammary hypertrophy  N62       Patient ID: Casey Colon, female    DOB: 06-Jun-1971, 49 y.o.   MRN: [id number]   History of Present Illness: Casey Colon is a 49 y.o.  female  with a history of symptomatic mammary hypertrophy.  She presents for preoperative evaluation for upcoming procedure, bilateral breast reduction with liposuction, scheduled for 04/24/2020 with Dr. Marla Roe.  Summary from previous visit: Patient's breasts are extremely large but fairly symmetric.  Sternal to nipple distance on the right is 40 cm and the left is 40 cm.  IMF distance is 18 cm.  She is 5 feet 6 inches tall and weighs 257 pounds.  Preoperative bra size equals 40 DD.  She would like to be a B if possible.  Estimated excess breast tissue to be removed at time of surgery is 950 g from each side.  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  The patient has not had problems with anesthesia.   Past Medical History: Allergies: Allergies  Allergen Reactions  . Dilaudid [Hydromorphone Hcl] Other (See Comments)    Chest heaviness/difficulty breathing  . Zoloft [Sertraline] Other (See Comments)    anger    Current Medications:  Current Outpatient Medications:  .  calcium carbonate (TUMS - DOSED IN MG ELEMENTAL CALCIUM) 500 MG chewable tablet, Chew 1-2 tablets by mouth 2 (two) times daily as needed (for heartburn/indigestion.). , Disp: , Rfl:  .  Cholecalciferol (VITAMIN D3) 2000 units TABS, Take 2,000 Units by mouth daily., Disp: , Rfl:  .  famotidine (PEPCID) 20 MG tablet, Take 20 mg by mouth 2 (two) times daily., Disp: , Rfl:  .  fexofenadine (ALLEGRA) 180 MG tablet, Take 180 mg by mouth daily as needed for allergies., Disp: , Rfl:  .  hydrOXYzine (VISTARIL) 25 MG capsule, Take 1 capsule (25 mg total) by mouth 3 (three) times daily as needed., Disp: 30 capsule, Rfl: 0 .  meloxicam (MOBIC) 7.5 MG tablet, TAKE 1 TABLET(7.5 MG) BY  MOUTH DAILY, Disp: 30 tablet, Rfl: 1 .  ondansetron (ZOFRAN ODT) 4 MG disintegrating tablet, Take 1 tablet (4 mg total) by mouth every 6 (six) hours as needed for nausea., Disp: 20 tablet, Rfl: 0 .  ranitidine (ZANTAC) 150 MG tablet, TAKE 1 TABLET(150 MG) BY MOUTH TWICE DAILY, Disp: 180 tablet, Rfl: 1 .  triamterene-hydrochlorothiazide (MAXZIDE-25) 37.5-25 MG tablet, Take 1 tablet by mouth daily., Disp: 90 tablet, Rfl: 1 .  aspirin-acetaminophen-caffeine (EXCEDRIN MIGRAINE) 250-250-65 MG tablet, Take 1 tablet by mouth every 6 (six) hours as needed for headache., Disp: , Rfl:   Past Medical Problems: Past Medical History:  Diagnosis Date  . Chronic headaches   . Edema   . GERD (gastroesophageal reflux disease)   . IBS (irritable bowel syndrome)     Past Surgical History: Past Surgical History:  Procedure Laterality Date  . LAPAROSCOPIC VAGINAL HYSTERECTOMY WITH SALPINGECTOMY Bilateral 05/09/2018   Procedure: LAPAROSCOPIC ASSISTED VAGINAL HYSTERECTOMY WITH BILATERAL  SALPINGECTOMY;  Surgeon: [name], MD;  Location: [facility] ORS;  Service: Gynecology;  Laterality: Bilateral;  . NO PAST SURGERIES      Social History: Social History   Socioeconomic History  . Marital status: Married    Spouse name: Not on file  . Number of children: Not on file  . Years of education: Not on file  . Highest education level: Not on file  Occupational History  . Not on file  Tobacco Use  . Smoking status: Never Smoker  . Smokeless tobacco: Never Used  Substance and Sexual Activity  . Alcohol use: No  . Drug use: No  . Sexual activity: Yes    Comment: same sex  Other Topics Concern  . Not on file  Social History Narrative  . Not on file   Social Determinants of Health   Financial Resource Strain:   . Difficulty of Paying Living Expenses:   Food Insecurity:   . Worried About Charity fundraiser in the Last Year:   . Arboriculturist in the Last Year:   Transportation Needs:   .  Film/video editor (Medical):   Marland Kitchen Lack of Transportation (Non-Medical):   Physical Activity:   . Days of Exercise per Week:   . Minutes of Exercise per Session:   Stress:   . Feeling of Stress :   Social Connections:   . Frequency of Communication with Friends and Family:   . Frequency of Social Gatherings with Friends and Family:   . Attends Religious Services:   . Active Member of Clubs or Organizations:   . Attends Archivist Meetings:   Marland Kitchen Marital Status:   Intimate Partner Violence:   . Fear of Current or Ex-Partner:   . Emotionally Abused:   Marland Kitchen Physically Abused:   . Sexually Abused:     Family History: Family History  Problem Relation Age of Onset  . Asthma Maternal Grandmother   . Ovarian cancer Neg Hx   . Colon cancer Neg Hx     Review of Systems: Review of Systems  Constitutional: Negative for chills and fever.  HENT: Negative for congestion and sore throat.   Respiratory: Negative for cough and shortness of breath.   Cardiovascular: Negative for chest pain and palpitations.  Gastrointestinal: Negative for abdominal pain, nausea and vomiting.  Musculoskeletal: Positive for back pain and neck pain. Negative for joint pain and myalgias.  Skin: Negative for itching and rash.    Physical Exam: Vital Signs BP 134/85 (BP Location: Left Arm, Patient Position: Sitting, Cuff Size: Large)   Pulse 100   Temp 97.8 F (36.6 C) (Temporal)   Ht 5\' 6"  (1.676 m)   Wt 255 lb 3.2 oz (115.8 kg)   LMP 04/20/2018 (Exact Date)   SpO2 100%   BMI 41.19 kg/m  Physical Exam Constitutional:      Appearance: Normal appearance. She is normal weight.  HENT:     Head: Normocephalic and atraumatic.  Eyes:     Extraocular Movements: Extraocular movements intact.  Cardiovascular:     Rate and Rhythm: Normal rate and regular rhythm.     Pulses: Normal pulses.     Heart sounds: Normal heart sounds.  Pulmonary:     Effort: Pulmonary effort is normal.     Breath  sounds: Normal breath sounds. No wheezing, rhonchi or rales.  Abdominal:     General: Bowel sounds are normal.     Palpations: Abdomen is soft.  Musculoskeletal:        General: No swelling. Normal range of motion.     Cervical back: Normal range of motion.  Skin:    General: Skin is warm and dry.     Coloration: Skin is not pale.     Findings: No erythema or rash.  Neurological:     General: No focal deficit present.     Mental Status: She is alert  and oriented to person, place, and time.  Psychiatric:        Mood and Affect: Mood normal.        Behavior: Behavior normal.        Thought Content: Thought content normal.        Judgment: Judgment normal.     Assessment/Plan:  Casey Colon scheduled for bilateral breast reduction with Liposuction with Dr. Marla Roe.  Risks, benefits, and alternatives of procedure discussed, questions answered and consent obtained.    Smoking Status: non-smoker Last Mammogram: 07/05/2019; Results: No suspicious findings, no malignancy.  Caprini Score: 4 Moderate; Risk Factors include: 49 year old female, BMI > 25, and length of planned surgery. Recommendation for mechanical or pharmacological prophylaxis during surgery. Encourage early ambulation.   Pictures obtained: 02/02/2020  Post-op Rx sent to pharmacy: Norco, Zofran, Keflex  Patient was provided with breast reduction and general surgical risk consent documents prior to their appointment.  They had adequate time to read through the consent forms and we also discussed them in person together during this preop appointment.  All of their questions were answered to their content.  Recommended calling if they have any further questions.  Consent form to be scanned into patient's chart.  The risk that can be encountered with breast reduction were discussed and include the following but not limited to these:  Breast asymmetry, fluid accumulation, firmness of the breast, inability to breast feed, loss of  nipple or areola, skin loss, decrease or no nipple sensation, fat necrosis of the breast tissue, bleeding, infection, healing delay.  There are risks of anesthesia, changes to skin sensation and injury to nerves or blood vessels.  The muscle can be temporarily or permanently injured.  You may have an allergic reaction to tape, suture, glue, blood products which can result in skin discoloration, swelling, pain, skin lesions, poor healing.  Any of these can lead to the need for revisonal surgery or stage procedures.  A reduction has potential to interfere with diagnostic procedures.  Nipple or breast piercing can increase risks of infection.  This procedure is best done when the breast is fully developed.  Changes in the breast will continue to occur over time.  Pregnancy can alter the outcomes of previous breast reduction surgery, weight gain and weigh loss can also effect the long term appearance.   The [place] was signed into law in 2016 which includes the topic of electronic health records.  This provides immediate access to information in MyChart.  This includes consultation notes, operative notes, office notes, lab results and pathology reports.  If you have any questions about what you read please let us know at your next visit or call us at the office.  We are right here with you.   Electronically signed by: Threasa Heads, PA-C 04/04/2020 10:23 AM

## 2020-04-04 ENCOUNTER — Other Ambulatory Visit: Payer: Self-pay | Admitting: Family Medicine

## 2020-04-04 ENCOUNTER — Ambulatory Visit (INDEPENDENT_AMBULATORY_CARE_PROVIDER_SITE_OTHER): Payer: Managed Care, Other (non HMO) | Admitting: Plastic Surgery

## 2020-04-04 ENCOUNTER — Other Ambulatory Visit: Payer: Self-pay

## 2020-04-04 ENCOUNTER — Encounter: Payer: Self-pay | Admitting: Plastic Surgery

## 2020-04-04 VITALS — BP 134/85 | HR 100 | Temp 97.8°F | Ht 66.0 in | Wt 255.2 lb

## 2020-04-04 DIAGNOSIS — N62 Hypertrophy of breast: Secondary | ICD-10-CM

## 2020-04-04 MED ORDER — CEPHALEXIN 500 MG PO CAPS
500.0000 mg | ORAL_CAPSULE | Freq: Four times a day (QID) | ORAL | 0 refills | Status: AC
Start: 2020-04-04 — End: 2020-04-07

## 2020-04-04 MED ORDER — HYDROCODONE-ACETAMINOPHEN 5-325 MG PO TABS: 1.0000 | ORAL_TABLET | Freq: Three times a day (TID) | ORAL | 0 refills | Status: AC | PRN

## 2020-04-04 MED ORDER — ONDANSETRON HCL 4 MG PO TABS: 4.0000 mg | ORAL_TABLET | Freq: Three times a day (TID) | ORAL | 0 refills | Status: DC | PRN

## 2020-04-10 IMAGING — DX LUMBAR SPINE - COMPLETE 4+ VIEW
5 series · 5 of 5 positions shown · non-contrast
Comparison: None.

CLINICAL DATA: Chronic lumbago

EXAM:
LUMBAR SPINE - COMPLETE 4+ VIEW

[lumbar spine ap]
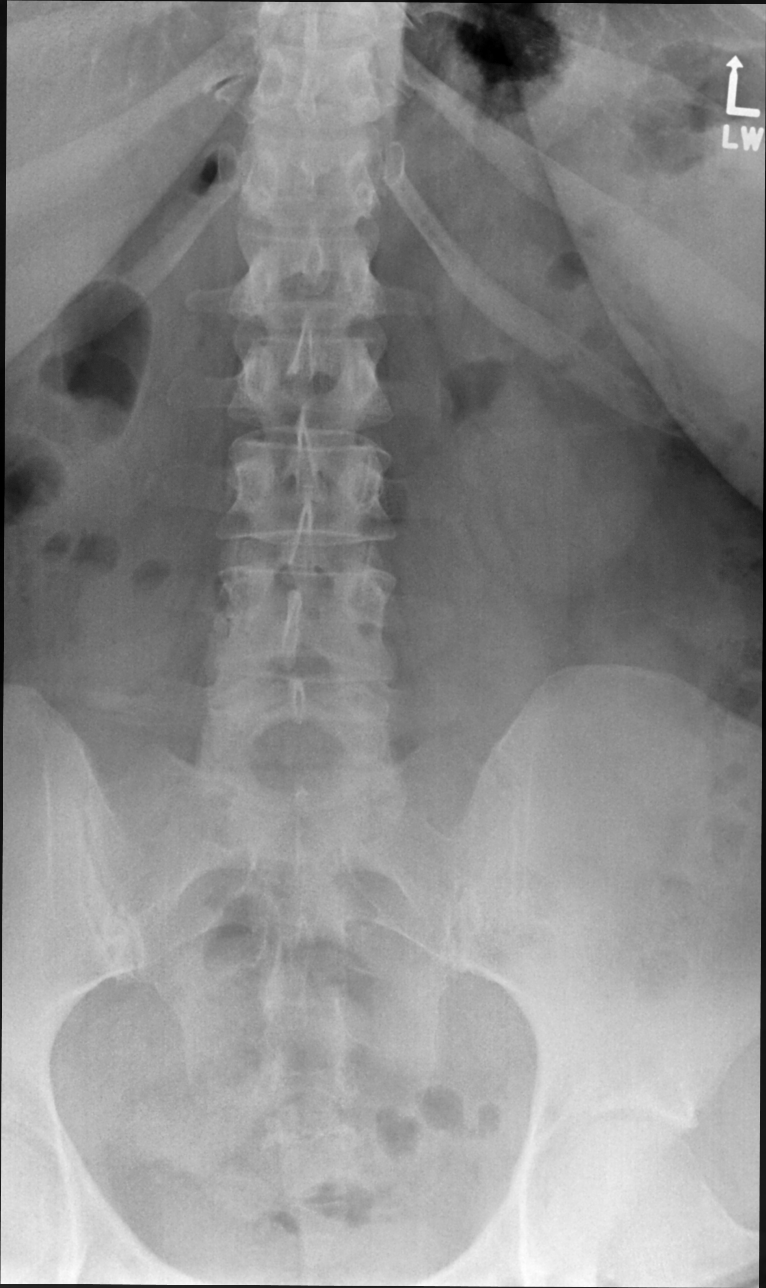

[lumbar spine obl (oblique) (1 of 2)]
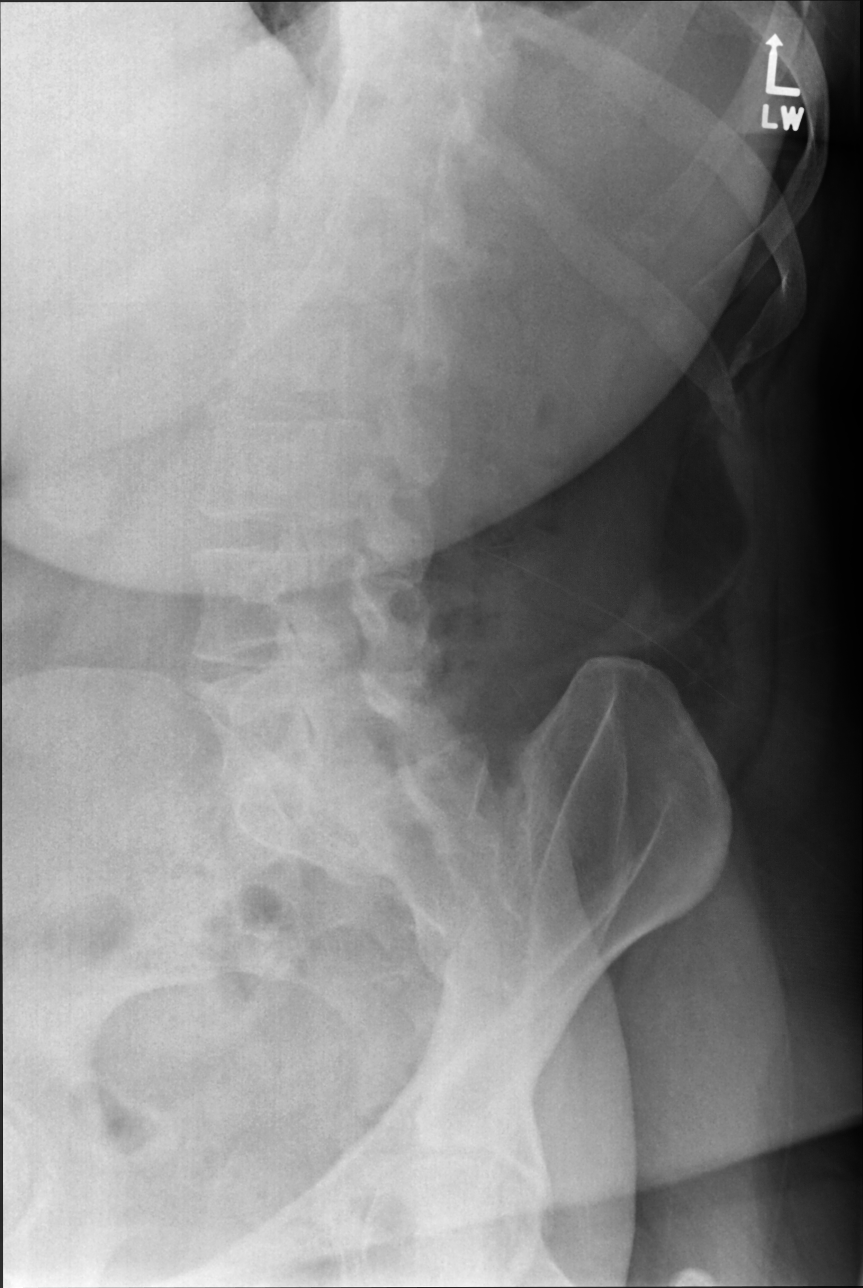

[lumbar spine obl (oblique) (2 of 2)]
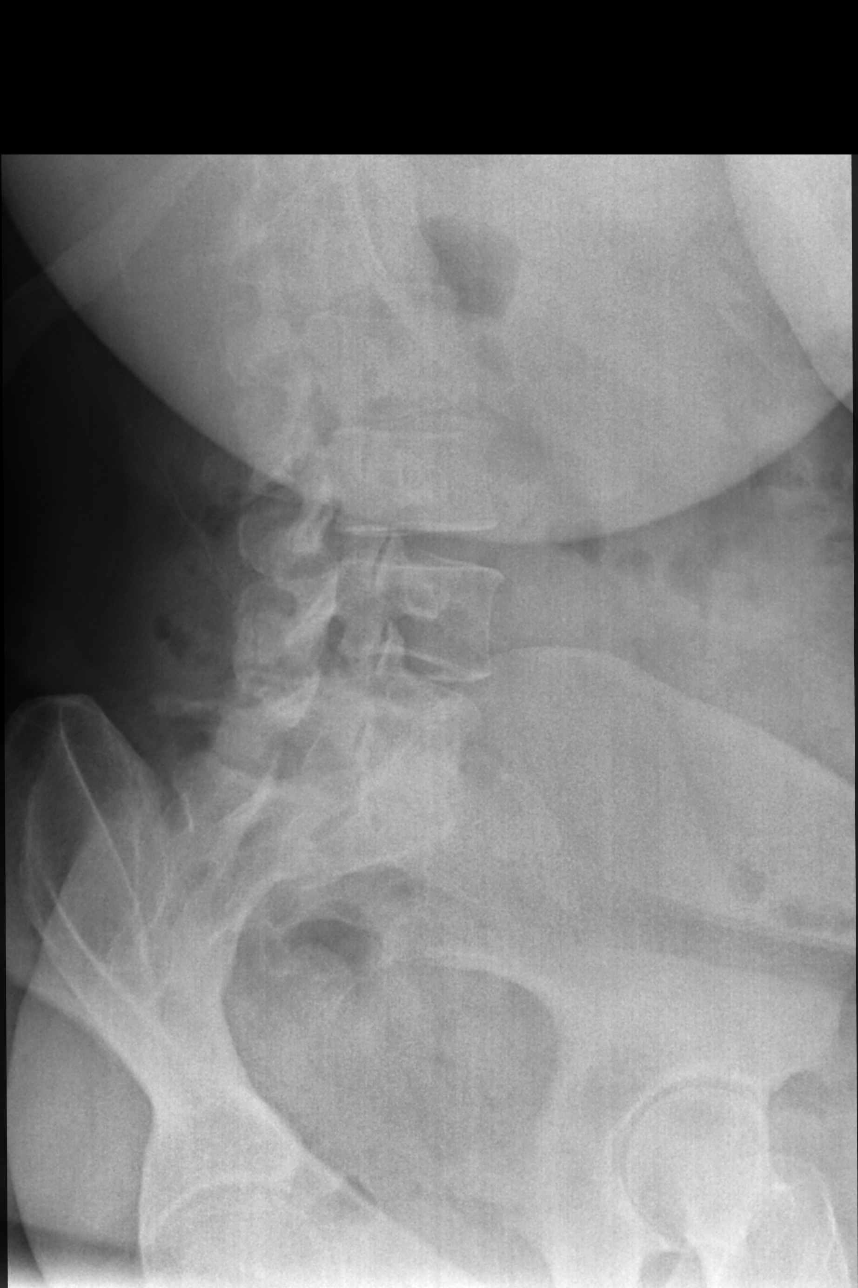

[lumbar spine lat]
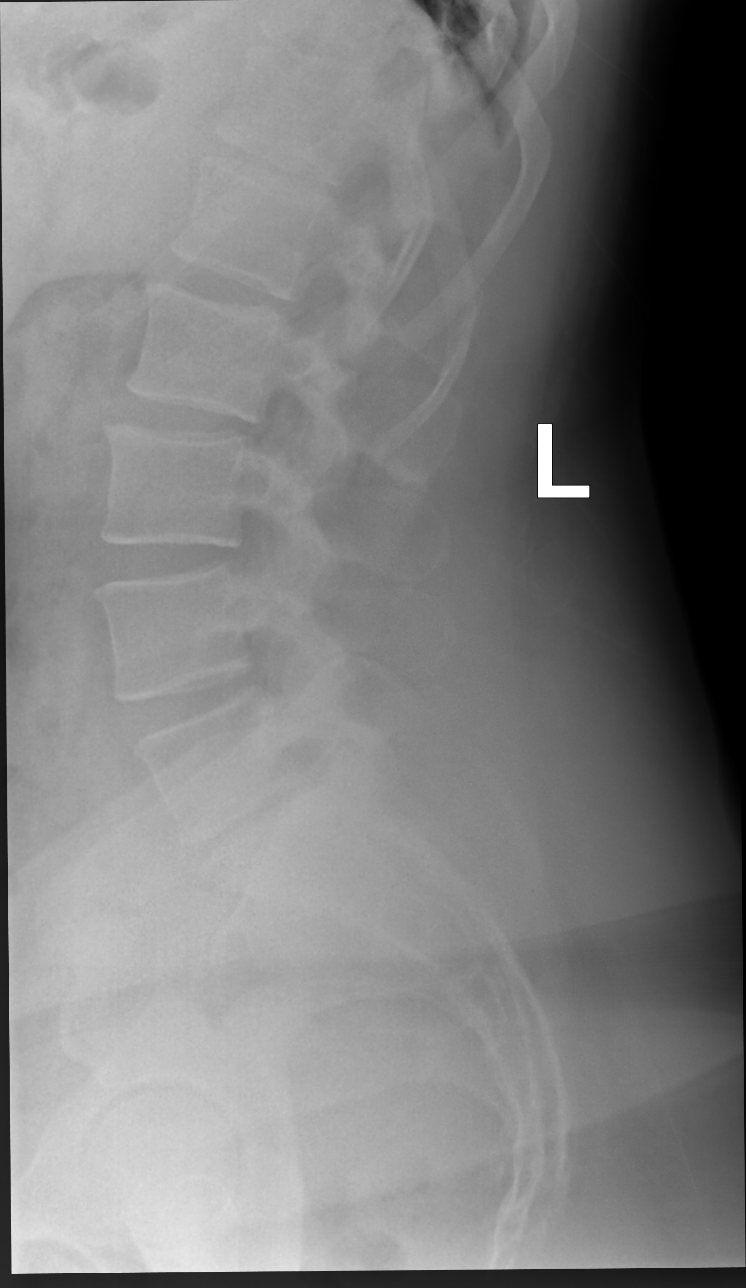

[lumbar spot lat]
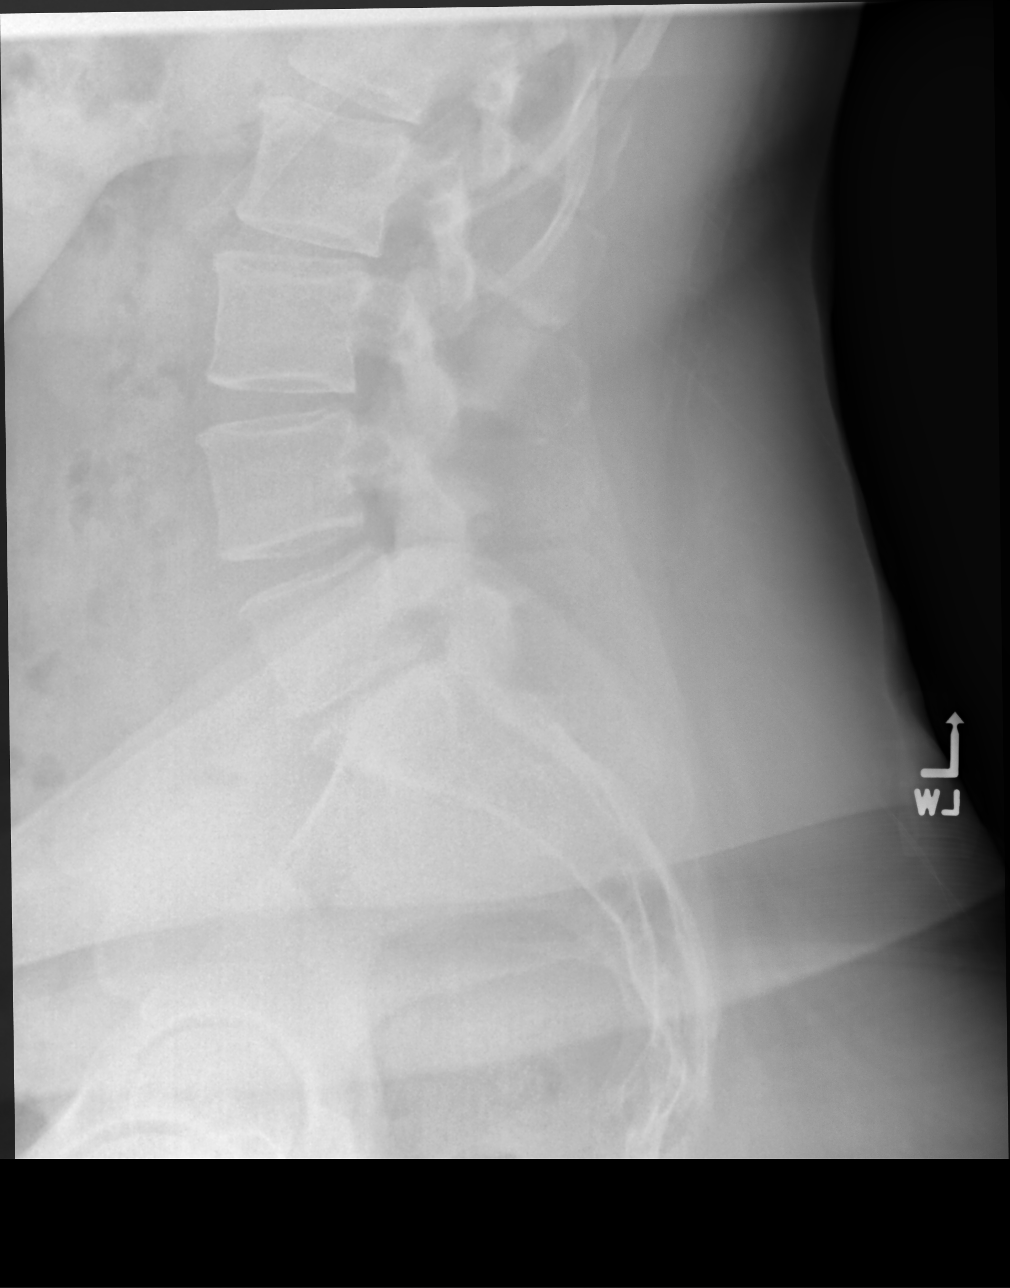

[5 of 5 positions shown; findings below may reference images not displayed]

FINDINGS: Frontal, lateral, spot lumbosacral lateral, and bilateral oblique
views were obtained. There are 5 non-rib-bearing lumbar type
vertebral bodies. There is no evident fracture or spondylolisthesis.
There is moderate disc space narrowing at L5-S1. Other disc spaces
appear unremarkable. There is facet osteoarthritic change at L4-5 on
the right and at L5-S1 bilaterally.
IMPRESSION: Lower lumbar osteoarthritic change, primarily at L5-S1. No fracture
or spondylolisthesis.

## 2020-04-15 ENCOUNTER — Other Ambulatory Visit: Payer: Self-pay | Admitting: Family Medicine

## 2020-04-18 ENCOUNTER — Encounter (HOSPITAL_BASED_OUTPATIENT_CLINIC_OR_DEPARTMENT_OTHER): Payer: Self-pay | Admitting: Plastic Surgery

## 2020-04-18 ENCOUNTER — Other Ambulatory Visit: Payer: Self-pay

## 2020-04-22 ENCOUNTER — Inpatient Hospital Stay: Admission: RE | Admit: 2020-04-22 | Payer: Managed Care, Other (non HMO) | Source: Ambulatory Visit

## 2020-04-22 ENCOUNTER — Other Ambulatory Visit (HOSPITAL_COMMUNITY)
Admission: RE | Admit: 2020-04-22 | Discharge: 2020-04-22 | Disposition: A | Discharge status: Home / Self Care | Payer: Managed Care, Other (non HMO) | Source: Ambulatory Visit | Attending: Plastic Surgery | Admitting: Plastic Surgery

## 2020-04-22 ENCOUNTER — Encounter (HOSPITAL_BASED_OUTPATIENT_CLINIC_OR_DEPARTMENT_OTHER)
Admission: RE | Admit: 2020-04-22 | Discharge: 2020-04-22 | Disposition: A | Discharge status: Home / Self Care | Payer: Managed Care, Other (non HMO) | Source: Ambulatory Visit | Attending: Plastic Surgery | Admitting: Plastic Surgery

## 2020-04-22 DIAGNOSIS — Z01818 Encounter for other preprocedural examination: Secondary | ICD-10-CM | POA: Diagnosis not present

## 2020-04-22 DIAGNOSIS — Z20822 Contact with and (suspected) exposure to covid-19: Secondary | ICD-10-CM | POA: Diagnosis not present

## 2020-04-22 LAB — BASIC METABOLIC PANEL
Anion gap: 7 (ref 5–15)
BUN: 18 mg/dL (ref 6–20)
CO2: 24 mmol/L (ref 22–32)
Calcium: 8.8 mg/dL — ABNORMAL LOW (ref 8.9–10.3)
Chloride: 106 mmol/L (ref 98–111)
Creatinine, Ser: 0.96 mg/dL (ref 0.44–1.00)
GFR calc Af Amer: >60 mL/min (ref >60)
GFR calc non Af Amer: >60 mL/min (ref >60)
Glucose, Bld: 109 mg/dL — ABNORMAL HIGH (ref 70–99)
Potassium: 4 mmol/L (ref 3.5–5.1)
Sodium: 137 mmol/L (ref 135–145)

## 2020-04-22 LAB — SARS CORONAVIRUS 2 (TAT 6-24 HRS): SARS Coronavirus 2: NEGATIVE

## 2020-04-22 NOTE — Progress Notes (Signed)
EKG reviewed by Dr. Doroteo Glassman, will proceed with surgery as scheduled.

## 2020-04-22 NOTE — Progress Notes (Signed)
Surgical soap given with instructions, pt verbalized understanding.  

## 2020-04-24 ENCOUNTER — Ambulatory Visit (HOSPITAL_BASED_OUTPATIENT_CLINIC_OR_DEPARTMENT_OTHER): Payer: Managed Care, Other (non HMO) | Admitting: Anesthesiology

## 2020-04-24 ENCOUNTER — Encounter (HOSPITAL_BASED_OUTPATIENT_CLINIC_OR_DEPARTMENT_OTHER): Payer: Self-pay | Admitting: Plastic Surgery

## 2020-04-24 ENCOUNTER — Encounter (HOSPITAL_BASED_OUTPATIENT_CLINIC_OR_DEPARTMENT_OTHER)
Admission: RE | Disposition: A | Discharge status: Home / Self Care | Payer: Self-pay | Source: Ambulatory Visit | Attending: Plastic Surgery

## 2020-04-24 ENCOUNTER — Other Ambulatory Visit: Payer: Self-pay

## 2020-04-24 ENCOUNTER — Ambulatory Visit (HOSPITAL_BASED_OUTPATIENT_CLINIC_OR_DEPARTMENT_OTHER)
Admission: RE | Admit: 2020-04-24 | Discharge: 2020-04-24 | Disposition: A | Discharge status: Home / Self Care | Payer: Managed Care, Other (non HMO) | Source: Ambulatory Visit | Attending: Plastic Surgery | Admitting: Plastic Surgery

## 2020-04-24 DIAGNOSIS — G8929 Other chronic pain: Secondary | ICD-10-CM | POA: Diagnosis not present

## 2020-04-24 DIAGNOSIS — M549 Dorsalgia, unspecified: Secondary | ICD-10-CM | POA: Insufficient documentation

## 2020-04-24 DIAGNOSIS — M542 Cervicalgia: Secondary | ICD-10-CM | POA: Diagnosis not present

## 2020-04-24 DIAGNOSIS — N62 Hypertrophy of breast: Secondary | ICD-10-CM | POA: Diagnosis not present

## 2020-04-24 DIAGNOSIS — Z79899 Other long term (current) drug therapy: Secondary | ICD-10-CM | POA: Diagnosis not present

## 2020-04-24 DIAGNOSIS — M546 Pain in thoracic spine: Secondary | ICD-10-CM | POA: Diagnosis not present

## 2020-04-24 DIAGNOSIS — Z888 Allergy status to other drugs, medicaments and biological substances status: Secondary | ICD-10-CM | POA: Insufficient documentation

## 2020-04-24 DIAGNOSIS — Z885 Allergy status to narcotic agent status: Secondary | ICD-10-CM | POA: Insufficient documentation

## 2020-04-24 DIAGNOSIS — Z791 Long term (current) use of non-steroidal anti-inflammatories (NSAID): Secondary | ICD-10-CM | POA: Insufficient documentation

## 2020-04-24 DIAGNOSIS — K219 Gastro-esophageal reflux disease without esophagitis: Secondary | ICD-10-CM | POA: Insufficient documentation

## 2020-04-24 DIAGNOSIS — Z6841 Body Mass Index (BMI) 40.0 and over, adult: Secondary | ICD-10-CM | POA: Insufficient documentation

## 2020-04-24 DIAGNOSIS — F419 Anxiety disorder, unspecified: Secondary | ICD-10-CM | POA: Insufficient documentation

## 2020-04-24 HISTORY — PX: BREAST REDUCTION SURGERY: SHX8

## 2020-04-24 SURGERY — BREAST REDUCTION WITH LIPOSUCTION
Anesthesia: General | Site: Breast | Laterality: Bilateral

## 2020-04-24 MED ORDER — LIDOCAINE-EPINEPHRINE 1 %-1:100000 IJ SOLN
INTRAMUSCULAR | Status: AC
  Filled 2020-04-24: qty 1

## 2020-04-24 MED ORDER — FENTANYL CITRATE (PF) 100 MCG/2ML IJ SOLN
INTRAMUSCULAR | Status: AC
  Filled 2020-04-24: qty 2

## 2020-04-24 MED ORDER — ACETAMINOPHEN 10 MG/ML IV SOLN
INTRAVENOUS | Status: AC
  Filled 2020-04-24: qty 100

## 2020-04-24 MED ORDER — ONDANSETRON HCL 4 MG/2ML IJ SOLN
INTRAMUSCULAR | Status: AC
  Filled 2020-04-24: qty 2

## 2020-04-24 MED ORDER — ONDANSETRON HCL 4 MG/2ML IJ SOLN
4.0000 mg | Freq: Once | INTRAMUSCULAR | Status: AC
  Administered 2020-04-24: 4 mg via INTRAVENOUS

## 2020-04-24 MED ORDER — LIDOCAINE HCL 1 % IJ SOLN
INTRAMUSCULAR | Status: DC | PRN
  Administered 2020-04-24: 50 mL

## 2020-04-24 MED ORDER — LACTATED RINGERS IV SOLN: INTRAVENOUS | Status: DC

## 2020-04-24 MED ORDER — LIDOCAINE HCL (CARDIAC) PF 100 MG/5ML IV SOSY
PREFILLED_SYRINGE | INTRAVENOUS | Status: DC | PRN
  Administered 2020-04-24: 60 mg via INTRAVENOUS

## 2020-04-24 MED ORDER — CHLORHEXIDINE GLUCONATE 4 % EX LIQD: 60.0000 mL | Freq: Once | CUTANEOUS | Status: DC

## 2020-04-24 MED ORDER — MIDAZOLAM HCL 2 MG/2ML IJ SOLN
INTRAMUSCULAR | Status: AC
  Filled 2020-04-24: qty 2

## 2020-04-24 MED ORDER — SODIUM CHLORIDE 0.9% FLUSH: 3.0000 mL | Freq: Two times a day (BID) | INTRAVENOUS | Status: DC

## 2020-04-24 MED ORDER — LACTATED RINGERS IV SOLN
INTRAVENOUS | Status: AC | PRN
  Administered 2020-04-24: 1000 mL

## 2020-04-24 MED ORDER — DIPHENHYDRAMINE HCL 50 MG/ML IJ SOLN: 12.5000 mg | Freq: Once | INTRAMUSCULAR | Status: DC

## 2020-04-24 MED ORDER — PHENYLEPHRINE 40 MCG/ML (10ML) SYRINGE FOR IV PUSH (FOR BLOOD PRESSURE SUPPORT)
PREFILLED_SYRINGE | INTRAVENOUS | Status: AC
  Filled 2020-04-24: qty 10

## 2020-04-24 MED ORDER — LIDOCAINE HCL (PF) 1 % IJ SOLN
INTRAMUSCULAR | Status: AC
  Filled 2020-04-24: qty 60

## 2020-04-24 MED ORDER — SODIUM CHLORIDE 0.9 % IV SOLN: 250.0000 mL | INTRAVENOUS | Status: DC | PRN

## 2020-04-24 MED ORDER — SODIUM CHLORIDE 0.9 % IV SOLN
INTRAVENOUS | Status: AC
  Filled 2020-04-24: qty 500000

## 2020-04-24 MED ORDER — DEXAMETHASONE SODIUM PHOSPHATE 4 MG/ML IJ SOLN
INTRAMUSCULAR | Status: DC | PRN
  Administered 2020-04-24: 5 mg via INTRAVENOUS

## 2020-04-24 MED ORDER — ONDANSETRON HCL 4 MG/2ML IJ SOLN
INTRAMUSCULAR | Status: DC | PRN
  Administered 2020-04-24: 4 mg via INTRAVENOUS

## 2020-04-24 MED ORDER — SUGAMMADEX SODIUM 500 MG/5ML IV SOLN
INTRAVENOUS | Status: DC | PRN
Start: 2020-04-24 — End: 2020-04-24
  Administered 2020-04-24: 200 mg via INTRAVENOUS

## 2020-04-24 MED ORDER — FENTANYL CITRATE (PF) 100 MCG/2ML IJ SOLN
25.0000 ug | INTRAMUSCULAR | Status: DC | PRN
  Administered 2020-04-24: 25 ug via INTRAVENOUS

## 2020-04-24 MED ORDER — SUGAMMADEX SODIUM 500 MG/5ML IV SOLN
INTRAVENOUS | Status: AC
  Filled 2020-04-24: qty 5

## 2020-04-24 MED ORDER — SODIUM CHLORIDE 0.9% FLUSH: 3.0000 mL | INTRAVENOUS | Status: DC | PRN

## 2020-04-24 MED ORDER — BUPIVACAINE HCL (PF) 0.25 % IJ SOLN
INTRAMUSCULAR | Status: AC
  Filled 2020-04-24: qty 30

## 2020-04-24 MED ORDER — EPINEPHRINE PF 1 MG/ML IJ SOLN
INTRAMUSCULAR | Status: AC
  Filled 2020-04-24: qty 1

## 2020-04-24 MED ORDER — DROPERIDOL 2.5 MG/ML IJ SOLN
INTRAMUSCULAR | Status: DC | PRN
  Administered 2020-04-24: .625 mg via INTRAVENOUS

## 2020-04-24 MED ORDER — ACETAMINOPHEN 10 MG/ML IV SOLN
INTRAVENOUS | Status: DC | PRN
  Administered 2020-04-24: 1000 mg via INTRAVENOUS

## 2020-04-24 MED ORDER — SUCCINYLCHOLINE CHLORIDE 200 MG/10ML IV SOSY
PREFILLED_SYRINGE | INTRAVENOUS | Status: AC
  Filled 2020-04-24: qty 10

## 2020-04-24 MED ORDER — CEFAZOLIN SODIUM-DEXTROSE 2-4 GM/100ML-% IV SOLN
2.0000 g | INTRAVENOUS | Status: AC
  Administered 2020-04-24: 2 g via INTRAVENOUS

## 2020-04-24 MED ORDER — MIDAZOLAM HCL 5 MG/5ML IJ SOLN
INTRAMUSCULAR | Status: DC | PRN
  Administered 2020-04-24: 2 mg via INTRAVENOUS

## 2020-04-24 MED ORDER — CEFAZOLIN SODIUM-DEXTROSE 2-4 GM/100ML-% IV SOLN
INTRAVENOUS | Status: AC
  Filled 2020-04-24: qty 100

## 2020-04-24 MED ORDER — FENTANYL CITRATE (PF) 100 MCG/2ML IJ SOLN
INTRAMUSCULAR | Status: DC | PRN
  Administered 2020-04-24 (×2): 50 ug via INTRAVENOUS
  Administered 2020-04-24 (×2): 25 ug via INTRAVENOUS
  Administered 2020-04-24: 50 ug via INTRAVENOUS
  Administered 2020-04-24: 100 ug via INTRAVENOUS

## 2020-04-24 MED ORDER — ACETAMINOPHEN 80 MG RE SUPP: 650.0000 mg | RECTAL | Status: DC | PRN

## 2020-04-24 MED ORDER — EPINEPHRINE PF 1 MG/ML IJ SOLN
INTRAMUSCULAR | Status: DC | PRN
  Administered 2020-04-24: 1 mg

## 2020-04-24 MED ORDER — ROCURONIUM BROMIDE 10 MG/ML (PF) SYRINGE
PREFILLED_SYRINGE | INTRAVENOUS | Status: AC
  Filled 2020-04-24: qty 10

## 2020-04-24 MED ORDER — PROPOFOL 10 MG/ML IV BOLUS
INTRAVENOUS | Status: DC | PRN
  Administered 2020-04-24: 200 mg via INTRAVENOUS

## 2020-04-24 MED ORDER — ACETAMINOPHEN 325 MG PO TABS: 650.0000 mg | ORAL_TABLET | ORAL | Status: DC | PRN

## 2020-04-24 MED ORDER — DEXAMETHASONE SODIUM PHOSPHATE 10 MG/ML IJ SOLN
INTRAMUSCULAR | Status: AC
  Filled 2020-04-24: qty 1

## 2020-04-24 MED ORDER — ROCURONIUM BROMIDE 100 MG/10ML IV SOLN
INTRAVENOUS | Status: DC | PRN
  Administered 2020-04-24: 100 mg via INTRAVENOUS

## 2020-04-24 MED ORDER — DIPHENHYDRAMINE HCL 50 MG/ML IJ SOLN
INTRAMUSCULAR | Status: AC
  Filled 2020-04-24: qty 1

## 2020-04-24 MED ORDER — LIDOCAINE-EPINEPHRINE 1 %-1:100000 IJ SOLN
INTRAMUSCULAR | Status: DC | PRN
  Administered 2020-04-24: 20 mL

## 2020-04-24 MED ORDER — LIDOCAINE 2% (20 MG/ML) 5 ML SYRINGE
INTRAMUSCULAR | Status: AC
  Filled 2020-04-24: qty 5

## 2020-04-24 MED ORDER — DROPERIDOL 2.5 MG/ML IJ SOLN
INTRAMUSCULAR | Status: AC
  Filled 2020-04-24: qty 2

## 2020-04-24 MED ORDER — BUPIVACAINE LIPOSOME 1.3 % IJ SUSP
INTRAMUSCULAR | Status: AC
  Filled 2020-04-24: qty 20

## 2020-04-24 MED ORDER — EPHEDRINE 5 MG/ML INJ
INTRAVENOUS | Status: AC
  Filled 2020-04-24: qty 10

## 2020-04-24 SURGICAL SUPPLY — 67 items
BAG DECANTER FOR FLEXI CONT (MISCELLANEOUS) ×2 IMPLANT
BINDER BREAST LRG (GAUZE/BANDAGES/DRESSINGS) IMPLANT
BINDER BREAST MEDIUM (GAUZE/BANDAGES/DRESSINGS) IMPLANT
BINDER BREAST XLRG (GAUZE/BANDAGES/DRESSINGS) IMPLANT
BINDER BREAST XXLRG (GAUZE/BANDAGES/DRESSINGS) IMPLANT
BIOPATCH RED 1 DISK 7.0 (GAUZE/BANDAGES/DRESSINGS) IMPLANT
BLADE HEX COATED 2.75 (ELECTRODE) ×2 IMPLANT
BLADE KNIFE PERSONA 10 (BLADE) ×4 IMPLANT
BLADE SURG 15 STRL LF DISP TIS (BLADE) IMPLANT
BLADE SURG 15 STRL SS (BLADE)
BNDG GAUZE ELAST 4 BULKY (GAUZE/BANDAGES/DRESSINGS) IMPLANT
CANISTER SUCT 1200ML W/VALVE (MISCELLANEOUS) ×2 IMPLANT
COVER BACK TABLE 60X90IN (DRAPES) ×2 IMPLANT
COVER MAYO STAND STRL (DRAPES) ×2 IMPLANT
COVER WAND RF STERILE (DRAPES) IMPLANT
DECANTER SPIKE VIAL GLASS SM (MISCELLANEOUS) IMPLANT
DERMABOND ADVANCED (GAUZE/BANDAGES/DRESSINGS) ×2
DERMABOND ADVANCED .7 DNX12 (GAUZE/BANDAGES/DRESSINGS) IMPLANT
DRAIN CHANNEL 15F RND FF W/TCR (WOUND CARE) ×2 IMPLANT
DRAIN CHANNEL 19F RND (DRAIN) IMPLANT
DRAPE LAPAROSCOPIC ABDOMINAL (DRAPES) ×2 IMPLANT
DRSG OPSITE POSTOP 3X4 (GAUZE/BANDAGES/DRESSINGS) ×2 IMPLANT
DRSG OPSITE POSTOP 4X6 (GAUZE/BANDAGES/DRESSINGS) ×2 IMPLANT
DRSG PAD ABDOMINAL 8X10 ST (GAUZE/BANDAGES/DRESSINGS) ×4 IMPLANT
ELECT BLADE 4.0 EZ CLEAN MEGAD (MISCELLANEOUS)
ELECT REM PT RETURN 9FT ADLT (ELECTROSURGICAL) ×2
ELECTRODE BLDE 4.0 EZ CLN MEGD (MISCELLANEOUS) IMPLANT
ELECTRODE REM PT RTRN 9FT ADLT (ELECTROSURGICAL) ×1 IMPLANT
EVACUATOR SILICONE 100CC (DRAIN) ×2 IMPLANT
GLOVE BIO SURGEON STRL SZ 6.5 (GLOVE) ×5 IMPLANT
GLOVE BIOGEL M 7.0 STRL (GLOVE) ×1 IMPLANT
GOWN STRL REUS W/ TWL LRG LVL3 (GOWN DISPOSABLE) ×2 IMPLANT
GOWN STRL REUS W/TWL LRG LVL3 (GOWN DISPOSABLE) ×3
NDL HYPO 25X1 1.5 SAFETY (NEEDLE) ×1 IMPLANT
NDL SAFETY ECLIPSE 18X1.5 (NEEDLE) IMPLANT
NEEDLE HYPO 18GX1.5 SHARP (NEEDLE)
NEEDLE HYPO 25X1 1.5 SAFETY (NEEDLE) ×2 IMPLANT
NS IRRIG 1000ML POUR BTL (IV SOLUTION) ×1 IMPLANT
PAD ALCOHOL SWAB (MISCELLANEOUS) ×1 IMPLANT
PENCIL SMOKE EVACUATOR (MISCELLANEOUS) ×2 IMPLANT
SET BASIN DAY SURGERY F.S. (CUSTOM PROCEDURE TRAY) ×2 IMPLANT
SLEEVE SCD COMPRESS KNEE MED (MISCELLANEOUS) ×2 IMPLANT
SPONGE LAP 18X18 RF (DISPOSABLE) ×6 IMPLANT
STRIP SUTURE WOUND CLOSURE 1/2 (MISCELLANEOUS) ×5 IMPLANT
SUT MNCRL AB 4-0 PS2 18 (SUTURE) ×10 IMPLANT
SUT MON AB 3-0 SH 27 (SUTURE) ×3
SUT MON AB 3-0 SH27 (SUTURE) ×1 IMPLANT
SUT MON AB 5-0 PS2 18 (SUTURE) ×9 IMPLANT
SUT PDS 3-0 CT2 (SUTURE)
SUT PDS AB 2-0 CT2 27 (SUTURE) IMPLANT
SUT PDS II 3-0 CT2 27 ABS (SUTURE) IMPLANT
SUT SILK 3 0 PS 1 (SUTURE) ×2 IMPLANT
SUT VIC AB 3-0 SH 27 (SUTURE)
SUT VIC AB 3-0 SH 27X BRD (SUTURE) IMPLANT
SUT VICRYL 4-0 PS2 18IN ABS (SUTURE) IMPLANT
SYR 3ML 23GX1 SAFETY (SYRINGE) ×1 IMPLANT
SYR 50ML LL SCALE MARK (SYRINGE) ×1 IMPLANT
SYR BULB IRRIG 60ML STRL (SYRINGE) ×2 IMPLANT
SYR CONTROL 10ML LL (SYRINGE) ×2 IMPLANT
TAPE MEASURE VINYL STERILE (MISCELLANEOUS) IMPLANT
TOWEL GREEN STERILE FF (TOWEL DISPOSABLE) ×4 IMPLANT
TRAY DSU PREP LF (CUSTOM PROCEDURE TRAY) ×2 IMPLANT
TUBE CONNECTING 20X1/4 (TUBING) ×2 IMPLANT
TUBING INFILTRATION IT-10001 (TUBING) IMPLANT
TUBING SET GRADUATE ASPIR 12FT (MISCELLANEOUS) IMPLANT
UNDERPAD 30X36 HEAVY ABSORB (UNDERPADS AND DIAPERS) ×4 IMPLANT
YANKAUER SUCT BULB TIP NO VENT (SUCTIONS) ×2 IMPLANT

## 2020-04-24 NOTE — Op Note (Signed)
Breast Reduction Op note:    DATE OF PROCEDURE: 04/24/2020  LOCATION: [facility]  SURGEON: Lyndee Leo Sanger Kweli Grassel, DO  ASSISTANT: Roetta Sessions, PA  PREOPERATIVE DIAGNOSIS 1. Macromastia 2. Neck Pain 3. Back Pain  POSTOPERATIVE DIAGNOSIS 1. Macromastia 2. Neck Pain 3. Back Pain  PROCEDURES 1. Bilateral breast reduction.  Right reduction 926 g, Left reduction [id number] g  COMPLICATIONS: None.  DRAINS: bilateral  INDICATIONS FOR PROCEDURE Casey Colon is a 49 y.o. year-old female born on Sep 30, 1971,with a history of symptomatic macromastia with concominant back pain, neck pain, shoulder grooving from her bra.   MRN: [id number]  CONSENT Informed consent was obtained directly from the patient. The risks, benefits and alternatives were fully discussed. Specific risks including but not limited to bleeding, infection, hematoma, seroma, scarring, pain, nipple necrosis, asymmetry, poor cosmetic results, and need for further surgery were discussed. The patient had ample opportunity to have her questions answered to her satisfaction.  DESCRIPTION OF PROCEDURE  Patient was brought into the operating room and placed in a supine position.  SCDs were placed and appropriate padding was performed.  Antibiotics were given. The patient underwent general anesthesia and the chest was prepped and draped in a sterile fashion.  A timeout was performed and all information was confirmed to be correct. Tumescent was placed in the lateral inferior portion of each breast.  Right side: Preoperative markings were confirmed.  Incision lines were injected with 1% Xylocaine with epinephrine.  After waiting for vasoconstriction, the marked lines were incised.  A Wise-pattern superomedial breast reduction was performed by de-epithelializing the pedicle, using bovie to create the superomedial pedicle, and removing breast tissue from the superior, lateral, and inferior portions of the breast.   Care was taken to not undermine the breast pedicle. Hemostasis was achieved.  The drain was placed and secured to the chest with the 3-0 Silk. The nipple was gently rotated into position and the soft tissue closed with 4-0 Monocryl.   The pocket was irrigated and hemostasis confirmed.  The deep tissues were approximated with 3-0 monocryl sutures and the skin was closed with deep dermal and subcuticular 4-0 Monocryl sutures.  The nipple and skin flaps had good capillary refill at the end of the procedure.    Left side: Preoperative markings were confirmed.  Incision lines were injected with 1% Xylocaine with epinephrine.  After waiting for vasoconstriction, the marked lines were incised.  A Wise-pattern superomedial breast reduction was performed by de-epithelializing the pedicle, using bovie to create the superomedial pedicle, and removing breast tissue from the superior, lateral, and inferior portions of the breast.  Care was taken to not undermine the breast pedicle. Hemostasis was achieved.  The nipple was gently rotated into position and the soft tissue was closed with 4-0 Monocryl.  The patient was sat upright and size and shape symmetry was confirmed.  The pocket was irrigated and hemostasis confirmed. Liposuction was performed on each breast at the inferior lateral portion.  The drain was placed and secured to the chest with the 3-0 Silk. The deep tissues were approximated with 3-0 monocryl sutures and the skin was closed with deep dermal and subcuticular 4-0 Monocryl sutures.  Dermabond was applied.  A breast binder and ABDs were placed.  The nipple and skin flaps had good capillary refill at the end of the procedure.  The patient tolerated the procedure well. The patient was allowed to wake from anesthesia and taken to the recovery room in satisfactory condition  The  advanced practice practitioner (APP) assisted throughout the case.  The APP was essential in retraction and counter traction when needed  to make the case progress smoothly.  This retraction and assistance made it possible to see the tissue plans for the procedure.  The assistance was needed for blood control, tissue re-approximation and assisted with closure of the incision site.

## 2020-04-24 NOTE — Interval H&P Note (Signed)
History and Physical Interval Note:  04/24/2020 9:49 AM  Casey Colon  has presented today for surgery, with the diagnosis of mammary hypertrophy.  The various methods of treatment have been discussed with the patient and family. After consideration of risks, benefits and other options for treatment, the patient has consented to  Procedure(s): BREAST REDUCTION WITH LIPOSUCTION (Bilateral) as a surgical intervention.  The patient's history has been reviewed, patient examined, no change in status, stable for surgery.  I have reviewed the patient's chart and labs.  Questions were answered to the patient's satisfaction.     Loel Lofty Awab Abebe

## 2020-04-24 NOTE — Anesthesia Postprocedure Evaluation (Signed)
Anesthesia Post Note  Patient: TANYIA HYRE  Procedure(s) Performed: BREAST REDUCTION WITH LIPOSUCTION (Bilateral Breast)     Patient location during evaluation: PACU Anesthesia Type: General Level of consciousness: awake and alert Pain management: pain level controlled Vital Signs Assessment: post-procedure vital signs reviewed and stable Respiratory status: spontaneous breathing, nonlabored ventilation, respiratory function stable and patient connected to nasal cannula oxygen Cardiovascular status: blood pressure returned to baseline and stable Postop Assessment: no apparent nausea or vomiting Anesthetic complications: no    Last Vitals:  Vitals:   04/24/20 1430 04/24/20 1516  BP: 102/66 115/76  Pulse: 80 82  Resp: 14 16  Temp:  36.8 C  SpO2: 97% 96%    Last Pain:  Vitals:   04/24/20 1516  TempSrc:   PainSc: 3                  Ajeenah Heiny L Holden Draughon

## 2020-04-24 NOTE — Anesthesia Preprocedure Evaluation (Addendum)
Anesthesia Evaluation  Patient identified by MRN, date of birth, ID band Patient awake    Reviewed: Allergy & Precautions, NPO status , Patient's Chart, lab work & pertinent test results  Airway Mallampati: I  TM Distance: >3 FB Neck ROM: Full    Dental no notable dental hx. (+) Teeth Intact, Dental Advisory Given   Pulmonary neg pulmonary ROS,    Pulmonary exam normal breath sounds clear to auscultation       Cardiovascular negative cardio ROS Normal cardiovascular exam Rhythm:Regular Rate:Normal     Neuro/Psych  Headaches, PSYCHIATRIC DISORDERS Anxiety    GI/Hepatic Neg liver ROS, GERD  Medicated and Controlled,  Endo/Other  Morbid obesity (BMI 40)  Renal/GU negative Renal ROS  negative genitourinary   Musculoskeletal negative musculoskeletal ROS (+)   Abdominal   Peds  Hematology negative hematology ROS (+)   Anesthesia Other Findings   Reproductive/Obstetrics                            Anesthesia Physical Anesthesia Plan  ASA: III  Anesthesia Plan: General   Post-op Pain Management:    Induction: Intravenous  PONV Risk Score and Plan: 3 and Midazolam, Dexamethasone and Ondansetron  Airway Management Planned: Oral ETT and LMA  Additional Equipment:   Intra-op Plan:   Post-operative Plan: Extubation in OR  Informed Consent: I have reviewed the patients History and Physical, chart, labs and discussed the procedure including the risks, benefits and alternatives for the proposed anesthesia with the patient or authorized representative who has indicated his/her understanding and acceptance.     Dental advisory given  Plan Discussed with: CRNA  Anesthesia Plan Comments:         Anesthesia Quick Evaluation

## 2020-04-24 NOTE — Discharge Instructions (Addendum)
INSTRUCTIONS FOR AFTER BREAST SURGERY   You are getting ready to undergo breast surgery.  You will likely have some questions about what to expect following your operation.  The following information will help you and your family understand what to expect when you are discharged from the hospital.  Following these guidelines will help ensure a smooth recovery and reduce risks of complications.   Postoperative instructions include information on: diet, wound care, medications and physical activity.  AFTER SURGERY Expect to go home after the procedure.  In some cases, you may need to spend one night in the hospital for observation.  DIET Breast surgery does not require a specific diet.  However, the healthier you eat the better your body can start healing. It is important to increasing your protein intake.  This means limiting the foods with sugar and carbohydrates.  Focus on vegetables and some meat.  If you have any liposuction during your procedure be sure to drink water.  If your urine is bright yellow, then it is concentrated, and you need to drink more water.  As a general rule after surgery, you should have 8 ounces of water every hour while awake.  If you find you are persistently nauseated or unable to take in liquids let us know.  NO TOBACCO USE or EXPOSURE.  This will slow your healing process and increase the risk of a wound.  WOUND CARE If you have a drain: You can shower five days after surgery.  Clean with baby wipes until the drain is removed.    If you have steri-strips / tape directly attached to your skin leave them in place. It is OK to get these wet.  No baths, pools or hot tubs for two weeks. We close your incision to leave the smallest and best-looking scar. No ointment or creams on your incisions until given the go ahead.  Especially not Neosporin (Too many skin reactions with this one).  A few weeks after surgery you can use Mederma and start massaging the scar. We ask you to  wear your binder or sports bra for the first 6 weeks around the clock, including while sleeping. This provides added comfort and helps reduce the fluid accumulation at the surgery site.  ACTIVITY No heavy lifting until cleared by the doctor.  This usually means no more than a half-gallon of milk.  It is OK to walk and climb stairs. In fact, moving your legs is very important to decrease your risk of a blood clot.  It will also help keep you from getting deconditioned.  Every 1 to 2 hours get up and walk for 5 minutes. This will help with a quicker recovery back to normal.  Let pain be your guide so you don't do too much.  This is not the time for spring cleaning and don't plan on taking care of anyone else.  This time is for you to recover,  You will be more comfortable if you sleep and rest with your head elevated either with a few pillows under you or in a recliner.  No stomach sleeping for a three months.  WORK Everyone returns to work at different times. As a rough guide, most people take at least 1 - 2 weeks off prior to returning to work. If you need documentation for your job, bring the forms to your postoperative follow up visit.  DRIVING Arrange for someone to bring you home from the hospital.  You may be able to drive a  few days after surgery but not while taking any narcotics or valium.  BOWEL MOVEMENTS Constipation can occur after anesthesia and while taking pain medication.  It is important to stay ahead for your comfort.  We recommend taking Milk of Magnesia (2 tablespoons; twice a day) while taking the pain pills.  SEROMA This is fluid your body tried to put in the surgical site.  This is normal but if it creates tight skinny skin let us know.  It usually decreases in a few weeks.  MEDICATIONS and PAIN CONTROL At your preoperative visit for you history and physical you were given the following medications: 1. An antibiotic: Start this medication when you get home and take according  to the instructions on the bottle. 2. Zofran 4 mg:  This is to treat nausea and vomiting.  You can take this every 6 hours as needed and only if needed. 3. Valium 2 mg: This is for muscle tightness if you have an implant or expander. This will help relax your muscle which also helps with pain control.  This can be taken every 12 hours as needed.  Don't drive after taking this medication. 4. Norco (hydrocodone/acetaminophen) 5/325 mg:  This is only to be used after you have taken the motrin or the tylenol. Every 8 hours as needed. Over the counter Medication to take: 5. Ibuprofen (Motrin) 600 mg:  Take this every 6 hours.  If you have additional pain then take 500 mg of the tylenol every 8 hours.  Only take the Norco after you have tried these two. 6. Miralax or stool softener of choice: Take this according to the bottle if you take the [place] Call your surgeon's office if any of the following occur: . Fever 101 degrees F or greater . Excessive bleeding or fluid from the incision site. . Pain that increases over time without aid from the medications . Redness, warmth, or pus draining from incision sites . Persistent nausea or inability to take in liquids . Severe misshapen area that underwent the operation.   About my Jackson-Pratt Bulb Drain  What is a Jackson-Pratt bulb? A Jackson-Pratt is a soft, round device used to collect drainage. It is connected to a long, thin drainage catheter, which is held in place by one or two small stiches near your surgical incision site. When the bulb is squeezed, it forms a vacuum, forcing the drainage to empty into the bulb.  Emptying the Jackson-Pratt bulb- To empty the bulb: 1. Release the plug on the top of the bulb. 2. Pour the bulb's contents into a measuring container which your nurse will provide. 3. Record the time emptied and amount of drainage. Empty the drain(s) as often as your     doctor or nurse recommends.  Date                   Time                    Amount (Drain 1)                 Amount (Drain 2)  _____________________________________________________________________  _____________________________________________________________________  _____________________________________________________________________  _____________________________________________________________________  _____________________________________________________________________  _____________________________________________________________________  _____________________________________________________________________  _____________________________________________________________________  Squeezing the Jackson-Pratt Bulb- To squeeze the bulb: 1. Make sure the plug at the top of the bulb is open. 2. Squeeze the bulb tightly in your fist. You will hear air squeezing from the bulb. 3. Replace the plug while the bulb  is squeezed. 4. Use a safety pin to attach the bulb to your clothing. This will keep the catheter from     pulling at the bulb insertion site.  When to call your doctor- Call your doctor if:  Drain site becomes red, swollen or hot.  You have a fever greater than 101 degrees F.  There is oozing at the drain site.  Drain falls out (apply a guaze bandage over the drain hole and secure it with tape).  Drainage increases daily not related to activity patterns. (You will usually have more drainage when you are active than when you are resting.)  Drainage has a bad odor.    Post Anesthesia Home Care Instructions  Activity: Get plenty of rest for the remainder of the day. A responsible individual must stay with you for 24 hours following the procedure.  For the next 24 hours, DO NOT: -Drive a car -Paediatric nurse -Drink alcoholic beverages -Take any medication unless instructed by your physician -Make any legal decisions or sign important papers.  Meals: Start with liquid foods such as gelatin or soup.  Progress to regular foods as tolerated. Avoid greasy, spicy, heavy foods. If nausea and/or vomiting occur, drink only clear liquids until the nausea and/or vomiting subsides. Call your physician if vomiting continues.  Special Instructions/Symptoms: Your throat may feel dry or sore from the anesthesia or the breathing tube placed in your throat during surgery. If this causes discomfort, gargle with warm salt water. The discomfort should disappear within 24 hours.       You received tylenol today during your stay. You may repeat tyleol if needed any time after 4:30pm

## 2020-04-24 NOTE — Transfer of Care (Signed)
Immediate Anesthesia Transfer of Care Note  Patient: Casey Colon  Procedure(s) Performed: BREAST REDUCTION WITH LIPOSUCTION (Bilateral Breast)  Patient Location: PACU  Anesthesia Type:General  Level of Consciousness: drowsy and patient cooperative  Airway & Oxygen Therapy: Patient Spontanous Breathing and Patient connected to face mask oxygen  Post-op Assessment: Report given to RN and Post -op Vital signs reviewed and stable  Post vital signs: Reviewed and stable  Last Vitals:  Vitals Value Taken Time  BP 126/69 04/24/20 1315  Temp    Pulse 88 04/24/20 1319  Resp 20 04/24/20 1319  SpO2 99 % 04/24/20 1319  Vitals shown include unvalidated device data.  Last Pain:  Vitals:   04/24/20 0823  TempSrc: Tympanic  PainSc: 0-No pain         Complications: No apparent anesthesia complications

## 2020-04-24 NOTE — Anesthesia Procedure Notes (Signed)
Procedure Name: Intubation Date/Time: 04/24/2020 10:14 AM Performed by: Willa Frater, CRNA Pre-anesthesia Checklist: Patient identified, Emergency Drugs available, Suction available and Patient being monitored Patient Re-evaluated:Patient Re-evaluated prior to induction Oxygen Delivery Method: Circle system utilized Preoxygenation: Pre-oxygenation with 100% oxygen Induction Type: IV induction Ventilation: Mask ventilation without difficulty Laryngoscope Size: Mac and 3 Grade View: Grade I Tube type: Oral Number of attempts: 1 Airway Equipment and Method: Stylet and Oral airway Placement Confirmation: ETT inserted through vocal cords under direct vision,  positive ETCO2 and breath sounds checked- equal and bilateral Secured at: 22 cm Tube secured with: Tape Dental Injury: Teeth and Oropharynx as per pre-operative assessment

## 2020-04-25 ENCOUNTER — Telehealth: Payer: Self-pay | Admitting: Plastic Surgery

## 2020-04-25 ENCOUNTER — Encounter: Payer: Self-pay | Admitting: *Deleted

## 2020-04-25 NOTE — Telephone Encounter (Signed)
Called and spoke with the patient and informed her of the message below-per Matthew,PA-C.  Patient verbalized understanding and agreed.//AB/CMA

## 2020-04-25 NOTE — Telephone Encounter (Signed)
I called the patient, was sent to voicemail. I did not leave a VMM. She is going to have a lot of swelling and her size will change after swelling resolves, recommend purchasing a sports bra. I do not know what size will be best for her, she will likely need to measure her chest circumference. Please relay message if she calls back.

## 2020-04-25 NOTE — Telephone Encounter (Signed)
Patient called to find out what cup size she is. She said a few different sizes were discussed but she didn't know and she would like to purchase a bra.

## 2020-04-26 LAB — SURGICAL PATHOLOGY

## 2020-05-03 ENCOUNTER — Encounter: Payer: Self-pay | Admitting: Surgical

## 2020-05-03 ENCOUNTER — Other Ambulatory Visit: Payer: Self-pay

## 2020-05-03 ENCOUNTER — Ambulatory Visit (INDEPENDENT_AMBULATORY_CARE_PROVIDER_SITE_OTHER): Payer: Managed Care, Other (non HMO) | Admitting: Surgical

## 2020-05-03 VITALS — BP 108/77 | HR 85 | Temp 98.0°F | Ht 66.0 in | Wt 254.0 lb

## 2020-05-03 DIAGNOSIS — Z9889 Other specified postprocedural states: Secondary | ICD-10-CM

## 2020-05-03 DIAGNOSIS — M546 Pain in thoracic spine: Secondary | ICD-10-CM

## 2020-05-03 DIAGNOSIS — M542 Cervicalgia: Secondary | ICD-10-CM

## 2020-05-03 DIAGNOSIS — N62 Hypertrophy of breast: Secondary | ICD-10-CM

## 2020-05-03 DIAGNOSIS — G8929 Other chronic pain: Secondary | ICD-10-CM

## 2020-05-03 NOTE — Progress Notes (Signed)
Patient is a 49 year old female here for follow-up after bilateral breast reduction on 04/24/2020 with Dr. Marla Roe.  She is doing well today.  No complaints other than JP drain insertion site pain.  No fevers, chills, nausea, vomiting, chest pain, shortness of breath.  She reports that her wife has been taking care of her at home.  She has been very active and been staying busy, but she has been avoiding heavy lifting.  JP drain output for the past few days has been approximately 3 cc from each drain every 24 hours.  She has been showering.  Chaperone present on exam   On exam bilateral NAC's with good color, good cap refill, honeycomb dressings in place.  She does have a little bit of bruising of the left breast.  Bilateral incisions are intact.  No wounds noted.  No drainage noted.  There is no erythema of either breast.  She does have a little bit of fat necrosis of bilateral breasts.  No fluid wave noted on exam.  JP drains in place along bilateral lateral breasts, serosanguineous fluid in bulbs..  Right breast is slightly larger than the left breast, likely some swelling, no fluid collection noted.   Plan:  Patient is doing well, no sign of infection, seroma, hematoma.  We removed bilateral JP drains today due to low output.  Patient tolerated this well.  She is very relieved to have them out.  Recommend continuing to wear sports bra or breast binder for 6 weeks postop 24/7.  Follow-up in 2 weeks for reevaluation.  Call with any questions or concerns.

## 2020-05-17 ENCOUNTER — Other Ambulatory Visit: Payer: Self-pay

## 2020-05-17 ENCOUNTER — Encounter: Payer: Self-pay | Admitting: Plastic Surgery

## 2020-05-17 ENCOUNTER — Ambulatory Visit (INDEPENDENT_AMBULATORY_CARE_PROVIDER_SITE_OTHER): Payer: Managed Care, Other (non HMO) | Admitting: Plastic Surgery

## 2020-05-17 VITALS — BP 127/90 | HR 91 | Temp 97.1°F

## 2020-05-17 DIAGNOSIS — N62 Hypertrophy of breast: Secondary | ICD-10-CM

## 2020-05-17 NOTE — Progress Notes (Signed)
   Subjective:    Patient ID: Casey Colon, female    DOB: [birth year], 49 y.o.   MRN: [id number]  The patient is a 49 year old female here for follow-up.  She underwent bilateral breast reduction.  She is healing very nicely.  She is very pleased with her size and shape.  There is no sign of fluid retention.  No sign of infection.  The incisions are healing well.  I removed the Tegaderm.  I will leave the Steri-Strips in place.     Review of Systems  Constitutional: Negative.   HENT: Negative.   Eyes: Negative.   Cardiovascular: Negative.   Gastrointestinal: Negative.   Genitourinary: Negative.   Musculoskeletal: Negative.        Objective:   Physical Exam Vitals and nursing note reviewed.  Constitutional:      Appearance: Normal appearance.  Cardiovascular:     Rate and Rhythm: Normal rate.     Pulses: Normal pulses.  Pulmonary:     Effort: Pulmonary effort is normal.  Neurological:     General: No focal deficit present.     Mental Status: She is alert and oriented to person, place, and time.  Psychiatric:        Mood and Affect: Mood normal.        Behavior: Behavior normal.        Assessment & Plan:     ICD-10-CM   1. Symptomatic mammary hypertrophy  N62    Continue with the sports bra.  She can do light massage for any firm areas.  Call with any questions otherwise we will see her back in about a month.  Pictures were obtained of the patient and placed in the chart with the patient's or guardian's permission.

## 2020-06-13 NOTE — Progress Notes (Signed)
Patient is a 49 year old female here for follow-up after undergoing bilateral breast reduction on 04/24/2020 with Dr. Marla Roe.  ~ 7 weeks PO  photos

## 2020-06-14 ENCOUNTER — Ambulatory Visit (INDEPENDENT_AMBULATORY_CARE_PROVIDER_SITE_OTHER): Payer: Managed Care, Other (non HMO) | Admitting: Plastic Surgery

## 2020-06-14 ENCOUNTER — Encounter: Payer: Self-pay | Admitting: Plastic Surgery

## 2020-06-14 ENCOUNTER — Other Ambulatory Visit: Payer: Self-pay

## 2020-06-14 VITALS — BP 127/84 | HR 82 | Temp 97.5°F

## 2020-06-14 DIAGNOSIS — N62 Hypertrophy of breast: Secondary | ICD-10-CM

## 2020-06-14 NOTE — Progress Notes (Signed)
   Subjective:    Patient ID: Casey Colon, female    DOB: 12-Apr-1971, 49 y.o.   MRN: [id number]  The patient is a 49 year old female here for follow-up after a breast reduction from May.  She had around 900 g removed from each breast.  She is extremely pleased with her results.  There is no sign of infection.  The incisions are all healing very nice.  The Steri-Strips have started to come off.  She is showering and just about back to her normal routine.   Review of Systems  Constitutional: Negative.   HENT: Negative.   Eyes: Negative.   Respiratory: Negative.  Negative for cough and chest tightness.   Cardiovascular: Negative.   Endocrine: Negative.   Genitourinary: Negative.        Objective:   Physical Exam Vitals and nursing note reviewed.  Constitutional:      Appearance: Normal appearance.  HENT:     Head: Normocephalic and atraumatic.  Cardiovascular:     Rate and Rhythm: Normal rate.     Pulses: Normal pulses.  Pulmonary:     Effort: Pulmonary effort is normal.  Neurological:     General: No focal deficit present.     Mental Status: She is alert. Mental status is at baseline.  Psychiatric:        Mood and Affect: Mood normal.        Behavior: Behavior normal.         Assessment & Plan:     ICD-10-CM   1. Symptomatic mammary hypertrophy  N62      Mammogram is due around now.  Recommend that she get it by the end of the year.  Follow-up as needed.  Continue with sports bra but does not have to use one that clips in the front.  Can transition to a regular bra by August.  Call with any questions or concerns.  We appreciate being a part of her treatment.  Pictures were obtained of the patient and placed in the chart with the patient's or guardian's permission.

## 2020-07-21 NOTE — Progress Notes (Signed)
Patient is a 49 year old female here for follow-up after undergoing bilateral breast reduction with Dr. Marla Roe on 04/24/2020. (~ 900 g removed from each side)  ~ 13 week PO Patient presents today with concerns of a lump in her left breast.  Lump is approximately 2 cm x 2 cm about midway down the vertical limb on the left breast.  Lump is near the surface and freely movable.  Suspect scar tissue and fat necrosis.  Patient denies pain.  Incisions are healing very nicely bilaterally, C/D/I.  No signs of infection, redness, drainage, seroma/hematoma.  Denies fever, chills, nausea/vomiting.  She may continue to do circular massage on the lump to encourage remodeling of scar tissue/fat necrosis.  Her last mammogram was due in June.  Recommend mammogram in the next few months to take more definitive look at the lump.   The [place] was signed into law in 2016 which includes the topic of electronic health records.  This provides immediate access to information in MyChart.  This includes consultation notes, operative notes, office notes, lab results and pathology reports.  If you have any questions about what you read please let us know at your next visit or call us at the office.  We are right here with you.

## 2020-07-22 ENCOUNTER — Encounter: Payer: Self-pay | Admitting: Plastic Surgery

## 2020-07-22 ENCOUNTER — Other Ambulatory Visit: Payer: Self-pay

## 2020-07-22 ENCOUNTER — Ambulatory Visit (INDEPENDENT_AMBULATORY_CARE_PROVIDER_SITE_OTHER): Payer: Managed Care, Other (non HMO) | Admitting: Plastic Surgery

## 2020-07-22 VITALS — BP 117/73 | HR 89 | Temp 98.6°F

## 2020-07-22 DIAGNOSIS — Z9889 Other specified postprocedural states: Secondary | ICD-10-CM

## 2020-10-11 ENCOUNTER — Telehealth: Payer: Self-pay | Admitting: Family Medicine

## 2020-10-11 NOTE — Telephone Encounter (Signed)
I called and LVM for the patient to call back. Casey Colon,cma   

## 2020-10-11 NOTE — Telephone Encounter (Signed)
The patient was supposed to have a mammogram of her left breast and [place] left axilla last year to evaluate pain and tenderness she was having her arm pit. Please see if she had this completed and see if she is still having any symptoms in her left arm pit. Thanks.

## 2020-10-14 NOTE — Telephone Encounter (Signed)
LVM informing the patient to answer her mychart message, I could not reach the patient by phone.  Dorcus Riga,cma

## 2020-11-18 ENCOUNTER — Other Ambulatory Visit: Payer: Self-pay | Admitting: Family Medicine

## 2021-06-05 ENCOUNTER — Other Ambulatory Visit: Payer: Self-pay | Admitting: Rehabilitation

## 2021-06-05 DIAGNOSIS — M47816 Spondylosis without myelopathy or radiculopathy, lumbar region: Secondary | ICD-10-CM

## 2021-06-11 ENCOUNTER — Other Ambulatory Visit: Payer: Self-pay

## 2021-06-11 ENCOUNTER — Emergency Department: Payer: BC Managed Care – PPO

## 2021-06-11 ENCOUNTER — Encounter: Payer: Self-pay | Admitting: Emergency Medicine

## 2021-06-11 ENCOUNTER — Telehealth: Payer: Self-pay | Admitting: Surgery

## 2021-06-11 ENCOUNTER — Emergency Department
Admission: EM | Admit: 2021-06-11 | Discharge: 2021-06-11 | Disposition: A | Discharge status: Home / Self Care | Payer: BC Managed Care – PPO | Attending: Emergency Medicine | Admitting: Emergency Medicine

## 2021-06-11 DIAGNOSIS — K219 Gastro-esophageal reflux disease without esophagitis: Secondary | ICD-10-CM | POA: Insufficient documentation

## 2021-06-11 DIAGNOSIS — Z7982 Long term (current) use of aspirin: Secondary | ICD-10-CM | POA: Insufficient documentation

## 2021-06-11 DIAGNOSIS — R519 Headache, unspecified: Secondary | ICD-10-CM | POA: Insufficient documentation

## 2021-06-11 DIAGNOSIS — K8020 Calculus of gallbladder without cholecystitis without obstruction: Secondary | ICD-10-CM | POA: Insufficient documentation

## 2021-06-11 DIAGNOSIS — R1011 Right upper quadrant pain: Secondary | ICD-10-CM

## 2021-06-11 DIAGNOSIS — R1010 Upper abdominal pain, unspecified: Secondary | ICD-10-CM | POA: Diagnosis present

## 2021-06-11 LAB — COMPREHENSIVE METABOLIC PANEL
ALT: 21 U/L (ref 0–44)
AST: 20 U/L (ref 15–41)
Albumin: 3.7 g/dL (ref 3.5–5.0)
Alkaline Phosphatase: 64 U/L (ref 38–126)
Anion gap: 5 (ref 5–15)
BUN: 20 mg/dL (ref 6–20)
CO2: 28 mmol/L (ref 22–32)
Calcium: 8.8 mg/dL — ABNORMAL LOW (ref 8.9–10.3)
Chloride: 103 mmol/L (ref 98–111)
Creatinine, Ser: 0.89 mg/dL (ref 0.44–1.00)
GFR, Estimated: >60 mL/min (ref >60)
Glucose, Bld: 125 mg/dL — ABNORMAL HIGH (ref 70–99)
Potassium: 3.3 mmol/L — ABNORMAL LOW (ref 3.5–5.1)
Sodium: 136 mmol/L (ref 135–145)
Total Bilirubin: 0.8 mg/dL (ref 0.3–1.2)
Total Protein: 7 g/dL (ref 6.5–8.1)

## 2021-06-11 LAB — CBC WITH DIFFERENTIAL/PLATELET
Abs Immature Granulocytes: 0.06 10*3/uL (ref 0.00–0.07)
Basophils Absolute: 0.1 10*3/uL (ref 0.0–0.1)
Basophils Relative: 1 %
Eosinophils Absolute: 0.1 10*3/uL (ref 0.0–0.5)
Eosinophils Relative: 1 %
HCT: 45 % (ref 36.0–46.0)
Hemoglobin: 14.8 g/dL (ref 12.0–15.0)
Immature Granulocytes: 1 %
Lymphocytes Relative: 15 %
Lymphs Abs: 1.9 10*3/uL (ref 0.7–4.0)
MCH: 29.8 pg (ref 26.0–34.0)
MCHC: 32.9 g/dL (ref 30.0–36.0)
MCV: 90.5 fL (ref 80.0–100.0)
Monocytes Absolute: 0.7 10*3/uL (ref 0.1–1.0)
Monocytes Relative: 6 %
Neutro Abs: 9.6 10*3/uL — ABNORMAL HIGH (ref 1.7–7.7)
Neutrophils Relative %: 76 %
Platelets: 219 10*3/uL (ref 150–400)
RBC: 4.97 MIL/uL (ref 3.87–5.11)
RDW: 13.2 % (ref 11.5–15.5)
WBC: 12.4 10*3/uL — ABNORMAL HIGH (ref 4.0–10.5)
nRBC: 0 % (ref 0.0–0.2)

## 2021-06-11 LAB — URINALYSIS, COMPLETE (UACMP) WITH MICROSCOPIC
Bacteria, UA: NONE SEEN
Bilirubin Urine: NEGATIVE
Glucose, UA: NEGATIVE mg/dL
Hgb urine dipstick: NEGATIVE
Ketones, ur: NEGATIVE mg/dL
Leukocytes,Ua: NEGATIVE
Nitrite: NEGATIVE
Protein, ur: NEGATIVE mg/dL
Specific Gravity, Urine: 1.026 (ref 1.005–1.030)
pH: 5 (ref 5.0–8.0)

## 2021-06-11 LAB — LIPASE, BLOOD: Lipase: 42 U/L (ref 11–51)

## 2021-06-11 MED ORDER — HYDROCODONE-ACETAMINOPHEN 5-325 MG PO TABS: 1.0000 | ORAL_TABLET | ORAL | 0 refills | Status: DC | PRN

## 2021-06-11 MED ORDER — DICYCLOMINE HCL 20 MG PO TABS: 20.0000 mg | ORAL_TABLET | Freq: Three times a day (TID) | ORAL | 0 refills | Status: AC | PRN

## 2021-06-11 MED ORDER — ONDANSETRON 4 MG PO TBDP
4.0000 mg | ORAL_TABLET | Freq: Four times a day (QID) | ORAL | 0 refills | Status: DC | PRN
Start: 2021-06-11 — End: 2021-06-25

## 2021-06-11 NOTE — Telephone Encounter (Signed)
Per Dr. Hampton Abbot, patient was seen in the ED today, found to have cholelithiasis and needs an appointment with Dr. Hampton Abbot.   Outgoing call is made and left message for patient to call us.

## 2021-06-11 NOTE — ED Triage Notes (Addendum)
Patient ambulatory to triage with steady gait, without difficulty or distress noted; pt reports rt sided abd pain since last night accomp by chills

## 2021-06-11 NOTE — ED Provider Notes (Signed)
[facility] Emergency Department Provider Note  ____________________________________________   Event Date/Time   First MD Initiated Contact with Patient 06/11/21 [phone]     (approximate)  I have reviewed the triage vital signs and the nursing notes.   HISTORY  Chief Complaint Abdominal Pain    HPI Casey Colon is a 50 y.o. female with history of GERD, IBS, migraine headaches who presents to the emergency department with complaints of upper abdominal pain.  States it has been ongoing for the past day.  She reports nausea without vomiting, diarrhea.  No bloody stools, melena.  No dysuria, hematuria, vaginal bleeding or discharge.  States she has been taking Excedrin several times a day for the past 3 days for frontal headache that she "cannot get rid of" states this feels similar to her previous migraines.  No head injury.  Not on blood thinners.  No neck pain or neck stiffness.  No fever.  Patient has had previous hysterectomy, bilateral salpingectomy.  Denies chest pain or shortness of breath.        Past Medical History:  Diagnosis Date   Chronic headaches    Edema    GERD (gastroesophageal reflux disease)    IBS (irritable bowel syndrome)     Patient Active Problem List   Diagnosis Date Noted   Back pain 10/06/2019   Neck pain 10/06/2019   Left axillary pain 08/17/2019   Anxiety 08/17/2019   Symptomatic mammary hypertrophy 08/17/2019   Chronic right-sided low back pain without sciatica 05/03/2019   Status post LAVH bilateral salpingectomy 05/09/2018   Climacteric 04/06/2018   Irregular menses 03/01/2018   Chronic pain of right knee 03/01/2018   Encounter for general adult medical examination with abnormal findings 02/26/2017   GERD (gastroesophageal reflux disease) 01/06/2017   Vitamin D deficiency 01/06/2017   Obesity 01/06/2017   IBS (irritable bowel syndrome) 01/06/2017   Bilateral lower extremity edema 01/06/2017    Past Surgical  History:  Procedure Laterality Date   ABDOMINAL HYSTERECTOMY     BREAST REDUCTION SURGERY Bilateral 04/24/2020   Procedure: BREAST REDUCTION WITH LIPOSUCTION;  Surgeon: Wallace Going, DO;  Location: [facility];  Service: Plastics;  Laterality: Bilateral;   BREAST SURGERY     LAPAROSCOPIC VAGINAL HYSTERECTOMY WITH SALPINGECTOMY Bilateral 05/09/2018   Procedure: LAPAROSCOPIC ASSISTED VAGINAL HYSTERECTOMY WITH BILATERAL  SALPINGECTOMY;  Surgeon: [name], MD;  Location: [facility];  Service: Gynecology;  Laterality: Bilateral;    Prior to Admission medications   Medication Sig Start Date End Date Taking? Authorizing Provider  dicyclomine (BENTYL) 20 MG tablet Take 1 tablet (20 mg total) by mouth every 8 (eight) hours as needed for spasms (Abdominal cramping). 06/11/21  Yes Leanthony Rhett, Delice Bison, DO  HYDROcodone-acetaminophen (NORCO/VICODIN) 5-325 MG tablet Take 1 tablet by mouth every 4 (four) hours as needed. 06/11/21  Yes Lyndsy Gilberto N, DO  ondansetron (ZOFRAN ODT) 4 MG disintegrating tablet Take 1 tablet (4 mg total) by mouth every 6 (six) hours as needed for nausea or vomiting. 06/11/21  Yes Jamoni Hewes, Delice Bison, DO  aspirin-acetaminophen-caffeine (EXCEDRIN MIGRAINE) [phone] MG tablet Take 1 tablet by mouth every 6 (six) hours as needed for headache.    [provider]  calcium carbonate (TUMS - DOSED IN MG ELEMENTAL CALCIUM) 500 MG chewable tablet Chew 1-2 tablets by mouth 2 (two) times daily as needed (for heartburn/indigestion.).     [provider]  Cholecalciferol (VITAMIN D3) 2000 units TABS Take 2,000 Units by mouth  daily.    [provider]  famotidine (PEPCID) 20 MG tablet Take 20 mg by mouth 2 (two) times daily.    [provider]  fexofenadine (ALLEGRA) 180 MG tablet Take 180 mg by mouth daily as needed for allergies.    [provider]  hydrOXYzine (VISTARIL) 25 MG capsule Take 1 capsule (25 mg total) by mouth 3  (three) times daily as needed. 08/16/19   Leone Haven, MD  meloxicam (MOBIC) 7.5 MG tablet TAKE 1 TABLET(7.5 MG) BY MOUTH DAILY 11/20/19   Leone Haven, MD  omeprazole (PRILOSEC) 20 MG capsule Take 20 mg by mouth daily.    [provider]  phentermine 15 MG capsule Take 15 mg by mouth daily. 05/15/20   [provider]  triamterene-hydrochlorothiazide (MAXZIDE-25) 37.5-25 MG tablet TAKE 1 TABLET BY MOUTH DAILY 11/18/20   Leone Haven, MD    Allergies Dilaudid [hydromorphone hcl] and Zoloft [sertraline]  Family History  Problem Relation Age of Onset   Asthma Maternal Grandmother    Ovarian cancer Neg Hx    Colon cancer Neg Hx     Social History Social History   Tobacco Use   Smoking status: Never   Smokeless tobacco: Never  Vaping Use   Vaping Use: Never used  Substance Use Topics   Alcohol use: No   Drug use: No    Review of Systems Constitutional: No fever. Eyes: No visual changes. ENT: No sore throat. Cardiovascular: Denies chest pain. Respiratory: Denies shortness of breath. Gastrointestinal: No vomiting, diarrhea. Genitourinary: Negative for dysuria. Musculoskeletal: Negative for back pain. Skin: Negative for rash. Neurological: Negative for focal weakness or numbness.  ____________________________________________   PHYSICAL EXAM:  VITAL SIGNS: ED Triage Vitals  Enc Vitals Group     BP 06/11/21 0413 134/80     Pulse Rate 06/11/21 0413 70     Resp 06/11/21 0413 16     Temp 06/11/21 0413 98.2 F (36.8 C)     Temp Source 06/11/21 0413 Oral     SpO2 06/11/21 0413 98 %     Weight 06/11/21 0406 225 lb (102.1 kg)     Height 06/11/21 0406 5\' 6"  (1.676 m)     Head Circumference --      Peak Flow --      Pain Score 06/11/21 0406 8     Pain Loc --      Pain Edu? --      Excl. in [place]? --    CONSTITUTIONAL: Alert and oriented and responds appropriately to questions. Well-appearing; well-nourished, afebrile, nontoxic,  pleasant HEAD: Normocephalic, atraumatic EYES: Conjunctivae clear, pupils appear equal, EOM appear intact ENT: normal nose; moist mucous membranes NECK: Supple, normal ROM, no meningismus CARD: RRR; S1 and S2 appreciated; no murmurs, no clicks, no rubs, no gallops RESP: Normal chest excursion without splinting or tachypnea; breath sounds clear and equal bilaterally; no wheezes, no rhonchi, no rales, no hypoxia or respiratory distress, speaking full sentences ABD/GI: Normal bowel sounds; non-distended; soft, tender to palpation of the upper abdomen and right upper quadrant with negative Murphy sign, no guarding or rebound, no tenderness at McBurney's point BACK: The back appears normal EXT: Normal ROM in all joints; no deformity noted, no edema; no cyanosis SKIN: Normal color for age and race; warm; no rash on exposed skin NEURO: Moves all extremities equally PSYCH: The patient's mood and manner are appropriate.  ____________________________________________   LABS (all labs ordered are listed, but only abnormal results are displayed)  Labs Reviewed  CBC WITH DIFFERENTIAL/PLATELET - Abnormal; Notable for the following components:      Result Value   WBC 12.4 (*)    Neutro Abs 9.6 (*)    All other components within normal limits  COMPREHENSIVE METABOLIC PANEL - Abnormal; Notable for the following components:   Potassium 3.3 (*)    Glucose, Bld 125 (*)    Calcium 8.8 (*)    All other components within normal limits  URINALYSIS, COMPLETE (UACMP) WITH MICROSCOPIC - Abnormal; Notable for the following components:   Color, Urine YELLOW (*)    APPearance HAZY (*)    All other components within normal limits  LIPASE, BLOOD   ____________________________________________  EKG  _____________________________________  RADIOLOGY I, Lamiyah Schlotter, personally viewed and evaluated these images (plain radiographs) as part of my medical decision making, as well as reviewing the written report by  the radiologist.  ED MD interpretation: Cholelithiasis without cholecystitis.  Official radiology report(s): US Abdomen Limited RUQ (LIVER/GB)  Result Date: 06/11/2021 CLINICAL DATA:  Right upper quadrant pain since last night EXAM: ULTRASOUND ABDOMEN LIMITED RIGHT UPPER QUADRANT COMPARISON:  None. FINDINGS: Gallbladder: Shadowing gallstones. No visible gallbladder wall edema or focal tenderness. Common bile duct: Diameter: 3 mm. A small segment of CBD is visualized with no visible filling defect. Liver: No focal lesion identified. Within normal limits in parenchymal echogenicity. Portal vein is patent on color Doppler imaging with normal direction of blood flow towards the liver. IMPRESSION: Cholelithiasis.  No signs of acute cholecystitis. Electronically Signed   By: Monte Fantasia M.D.   On: 06/11/2021 06:56    ____________________________________________   PROCEDURES  Procedure(s) performed (including Critical Care):  Procedures    ____________________________________________   INITIAL IMPRESSION / ASSESSMENT AND PLAN / ED COURSE  As part of my medical decision making, I reviewed the following data within the [facility] notes reviewed and incorporated, Labs reviewed , Old chart reviewed, Radiograph reviewed , Notes from prior ED visits, and [place] Controlled Substance Database         Patient here with upper abdominal pain, right upper quadrant abdominal pain.  Labs, urine today are reassuring.  Will obtain right upper quadrant ultrasound as differential includes cholecystitis, biliary colic, choledocholithiasis.  LFTs, lipase normal.  Mildly elevated white blood cell count with left shift.  No fever here.  Doubt pancreatitis.  Doubt appendicitis.  Doubt ACS, PE, dissection.  Patient declines pain or nausea medicine at this time.  ED PROGRESS  Ultrasound shows cholelithiasis without cholecystitis or choledocholithiasis.  Patient continues to decline pain or  nausea medicine at this time.  Recommended low-fat diet.  Will discharge with prescriptions for Bentyl, Zofran, Vicodin and give outpatient general surgery follow-up information.  Patient comfortable with this plan.  Discussed return precautions.  At this time, I do not feel there is any life-threatening condition present. I have reviewed, interpreted and discussed all results (EKG, imaging, lab, urine as appropriate) and exam findings with patient/family. I have reviewed nursing notes and appropriate previous records.  I feel the patient is safe to be discharged home without further emergent workup and can continue workup as an outpatient as needed. Discussed usual and customary return precautions. Patient/family verbalize understanding and are comfortable with this plan.  Outpatient follow-up has been provided as needed. All questions have been answered.  ____________________________________________   FINAL CLINICAL IMPRESSION(S) / ED DIAGNOSES  Final diagnoses:  RUQ abdominal pain  Calculus of gallbladder without cholecystitis without obstruction  ED Discharge Orders          Ordered    dicyclomine (BENTYL) 20 MG tablet  Every 8 hours PRN        06/11/21 0717    ondansetron (ZOFRAN ODT) 4 MG disintegrating tablet  Every 6 hours PRN        06/11/21 0717    HYDROcodone-acetaminophen (NORCO/VICODIN) 5-325 MG tablet  Every 4 hours PRN        06/11/21 0717            *Please note:  SRISHTI STRNAD was evaluated in Emergency Department on 06/11/2021 for the symptoms described in the history of present illness. She was evaluated in the context of the global COVID-19 pandemic, which necessitated consideration that the patient might be at risk for infection with the SARS-CoV-2 virus that causes COVID-19. Institutional protocols and algorithms that pertain to the evaluation of patients at risk for COVID-19 are in a state of rapid change based on information released by regulatory bodies  including the CDC and federal and state organizations. These policies and algorithms were followed during the patient's care in the ED.  Some ED evaluations and interventions may be delayed as a result of limited staffing during and the pandemic.*   Note:  This document was prepared using Dragon voice recognition software and may include unintentional dictation errors.    Alma Muegge, Delice Bison, DO 06/11/21 [phone]

## 2021-06-11 NOTE — Discharge Instructions (Addendum)

## 2021-06-11 NOTE — ED Notes (Signed)
Patient aware that we need urine sample for testing, unable at this time. Pt given instruction on providing urine sample when able to do so.   

## 2021-06-11 NOTE — ED Notes (Signed)
E sig pad not working. Pt A&OX4, ambulatory at d/c with independent steady gait, NAD. Pt verbalized understanding of d/c instructions and follow up care.

## 2021-06-16 ENCOUNTER — Ambulatory Visit (INDEPENDENT_AMBULATORY_CARE_PROVIDER_SITE_OTHER): Payer: BC Managed Care – PPO | Admitting: Surgery

## 2021-06-16 ENCOUNTER — Encounter: Payer: Self-pay | Admitting: Surgery

## 2021-06-16 ENCOUNTER — Other Ambulatory Visit: Payer: Self-pay

## 2021-06-16 ENCOUNTER — Telehealth: Payer: Self-pay | Admitting: Surgery

## 2021-06-16 VITALS — BP 118/80 | HR 72 | Temp 98.3°F | Ht 66.0 in | Wt 231.0 lb

## 2021-06-16 DIAGNOSIS — K8020 Calculus of gallbladder without cholecystitis without obstruction: Secondary | ICD-10-CM

## 2021-06-16 NOTE — Telephone Encounter (Signed)
Patient has been advised of Pre-Admission date/time, COVID Testing date and Surgery date.  Surgery Date: 06/25/21 Preadmission Testing Date: 06/23/21 (phone 1p-5p) Covid Testing Date: Not needed.    Patient has been made aware to call [phone], between 1-3:00pm the day before surgery, to find out what time to arrive for surgery.

## 2021-06-16 NOTE — Patient Instructions (Signed)
Our surgery scheduler Pamala Hurry will call you within 24-48 hours to get you scheduled. If you have not heard from her after 48 hours, please call our office. You will not need to get Covid tested before surgery and have the blue sheet available when she calls to write down important information.   If you have any concerns or questions, please feel free to call our office.   Minimally Invasive Cholecystectomy  Minimally invasive cholecystectomy is surgery to remove the gallbladder. The gallbladder is a pear-shaped organ that lies beneath the liver on the right side of the body. The gallbladder stores bile, which is a fluid that helps the body digest fats. Cholecystectomy is often done to treat inflammation of the gallbladder (cholecystitis). This condition is usually caused by a buildup of gallstones (cholelithiasis) in the gallbladder. Gallstones can block the flow of bile, which can resultin inflammation and pain. In severe cases, emergency surgery may be required. This procedure is done though small incisions in the abdomen, instead of one large incision. It is also called laparoscopic surgery. A thin scope with a camera (laparoscope) is inserted through one incision. Then surgical instruments are inserted through the other incisions. In some cases, a minimally invasive surgery may need to be changed to a surgery that is done through a larger incision. This iscalled open surgery. Tell a health care provider about: Any allergies you have. All medicines you are taking, including vitamins, herbs, eye drops, creams, and over-the-counter medicines. Any problems you or family members have had with anesthetic medicines. Any blood disorders you have. Any surgeries you have had. Any medical conditions you have. Whether you are pregnant or may be pregnant. What are the risks? Generally, this is a safe procedure. However, problems may occur, including: Infection. Bleeding. Allergic reactions to  medicines. Damage to nearby structures or organs. A stone remaining in the common bile duct. The common bile duct carries bile from the gallbladder into the small intestine. A bile leak from the cyst duct that is clipped when your gallbladder is removed. What happens before the procedure? Staying hydrated Follow instructions from your health care provider about hydration, which may include: Up to 2 hours before the procedure - you may continue to drink clear liquids, such as water, clear fruit juice, black coffee, and plain tea.  Eating and drinking restrictions Follow instructions from your health care provider about eating and drinking, which may include: 8 hours before the procedure - stop eating heavy meals or foods, such as meat, fried foods, or fatty foods. 6 hours before the procedure - stop eating light meals or foods, such as toast or cereal. 6 hours before the procedure - stop drinking milk or drinks that contain milk. 2 hours before the procedure - stop drinking clear liquids. Medicines Ask your health care provider about: Changing or stopping your regular medicines. This is especially important if you are taking diabetes medicines or blood thinners. Taking medicines such as aspirin and ibuprofen. These medicines can thin your blood. Do not take these medicines unless your health care provider tells you to take them. Taking over-the-counter medicines, vitamins, herbs, and supplements. General instructions Let your health care provider know if you develop a cold or an infection before surgery. Plan to have someone take you home from the hospital or clinic. If you will be going home right after the procedure, plan to have someone with you for 24 hours. Ask your health care provider: How your surgery site will be marked. What steps  will be taken to help prevent infection. These may include: Removing hair at the surgery site. Washing skin with a germ-killing soap. Taking  antibiotic medicine. What happens during the procedure?  An IV will be inserted into one of your veins. You will be given one or both of the following: A medicine to help you relax (sedative). A medicine to make you fall asleep (general anesthetic). A breathing tube will be placed in your mouth. Your surgeon will make several small incisions in your abdomen. The laparoscope will be inserted through one of the small incisions. The camera on the laparoscope will send images to a monitor in the operating room. This lets your surgeon see inside your abdomen. A gas will be pumped into your abdomen. This will expand your abdomen to give the surgeon more room to perform the surgery. Other tools that are needed for the procedure will be inserted through the other incisions. The gallbladder will be removed through one of the incisions. Your common bile duct may be examined. If stones are found in the common bile duct, they may be removed. After your gallbladder has been removed, the incisions will be closed with stitches (sutures), staples, or skin glue. Your incisions may be covered with a bandage (dressing). The procedure may vary among health care providers and hospitals. What happens after the procedure? Your blood pressure, heart rate, breathing rate, and blood oxygen level will be monitored until you leave the hospital or clinic. You will be given medicines as needed to control your pain. If you were given a sedative during the procedure, it can affect you for several hours. Do not drive or operate machinery until your health care provider says that it is safe. Summary Minimally invasive cholecystectomy, also called laparoscopic cholecystectomy, is surgery to remove the gallbladder using small incisions. Tell your health care provider about all the medical conditions you have and all the medicines you are taking for those conditions. Before the procedure, follow instructions about eating or  drinking restrictions and changing or stopping medicines. If you were given a sedative during the procedure, it can affect you for several hours. Do not drive or operate machinery until your health care provider says that it is safe. This information is not intended to replace advice given to you by your health care provider. Make sure you discuss any questions you have with your healthcare provider. Document Revised: 08/28/2019 Document Reviewed: 08/28/2019 Elsevier Patient Education  [place].

## 2021-06-16 NOTE — Progress Notes (Signed)
06/16/2021  Reason for Visit:  Symptomatic cholelithiasis  History of Present Illness: Casey Colon is a 50 y.o. female presenting for evaluation of symptomatic cholelithiasis.  Patient presented to the ED on 7/6 with a 1-day history of upper abdominal and RUQ pain, associated with nausea but no emesis.  She does report having some chills.  The patient had workup in the ED which showed only mildly elevated WBC of 12.4, with normal LFTs and lipase, and with an U/S which showed cholelithiasis without evidence of cholecystitis.  I have personally viewed the images and agree with the findings.  The patient's symptoms improved while she was in the ED and she was discharged with outpatient follow up.  She has not had pain issues since her discharge.  She does report that she had a similar though less severe episode about two months ago, which she had thought was indigestion.  Denies any fevers, current chills, chest pain, shortness of breath, nausea, vomiting.  Past Medical History: Past Medical History:  Diagnosis Date   Chronic headaches    Edema    GERD (gastroesophageal reflux disease)    IBS (irritable bowel syndrome)      Past Surgical History: Past Surgical History:  Procedure Laterality Date   ABDOMINAL HYSTERECTOMY     BREAST REDUCTION SURGERY Bilateral 04/24/2020   Procedure: BREAST REDUCTION WITH LIPOSUCTION;  Surgeon: Wallace Going, DO;  Location: [place];  Service: Plastics;  Laterality: Bilateral;   BREAST SURGERY     LAPAROSCOPIC VAGINAL HYSTERECTOMY WITH SALPINGECTOMY Bilateral 05/09/2018   Procedure: LAPAROSCOPIC ASSISTED VAGINAL HYSTERECTOMY WITH BILATERAL  SALPINGECTOMY;  Surgeon: [name], MD;  Location: [facility];  Service: Gynecology;  Laterality: Bilateral;    Home Medications: Prior to Admission medications   Medication Sig Start Date End Date Taking? Authorizing Provider  aspirin-acetaminophen-caffeine (EXCEDRIN MIGRAINE)  [phone] MG tablet Take 1 tablet by mouth every 6 (six) hours as needed for headache.   Yes [provider]  calcium carbonate (TUMS - DOSED IN MG ELEMENTAL CALCIUM) 500 MG chewable tablet Chew 1-2 tablets by mouth 2 (two) times daily as needed (for heartburn/indigestion.).    Yes [provider]  Cholecalciferol (VITAMIN D3) 2000 units TABS Take 2,000 Units by mouth daily.   Yes [provider]  fexofenadine (ALLEGRA) 180 MG tablet Take 180 mg by mouth daily as needed for allergies.   Yes [provider]  hydrOXYzine (VISTARIL) 25 MG capsule Take 1 capsule (25 mg total) by mouth 3 (three) times daily as needed. 08/16/19  Yes Leone Haven, MD  omeprazole (PRILOSEC) 20 MG capsule Take 20 mg by mouth daily.   Yes [provider]  ondansetron (ZOFRAN ODT) 4 MG disintegrating tablet Take 1 tablet (4 mg total) by mouth every 6 (six) hours as needed for nausea or vomiting. 06/11/21  Yes Ward, Delice Bison, DO  phentermine 15 MG capsule Take 15 mg by mouth daily. 05/15/20  Yes [provider]  triamterene-hydrochlorothiazide (MAXZIDE-25) 37.5-25 MG tablet TAKE 1 TABLET BY MOUTH DAILY 11/18/20  Yes Leone Haven, MD  dicyclomine (BENTYL) 20 MG tablet Take 1 tablet (20 mg total) by mouth every 8 (eight) hours as needed for spasms (Abdominal cramping). Patient not taking: Reported on 06/16/2021 06/11/21   Ward, Delice Bison, DO    Allergies: Allergies  Allergen Reactions   Dilaudid [Hydromorphone Hcl] Other (See Comments)    Chest heaviness/difficulty breathing   Zoloft [Sertraline] Other (See Comments)    anger  Social History:  reports that she has never smoked. She has never used smokeless tobacco. She reports that she does not drink alcohol and does not use drugs.   Family History: Family History  Problem Relation Age of Onset   Asthma Maternal Grandmother    Ovarian cancer Neg Hx    Colon cancer Neg Hx     Review of Systems: Review of  Systems  Constitutional:  Positive for chills. Negative for fever.  HENT:  Negative for hearing loss.   Respiratory:  Negative for shortness of breath.   Cardiovascular:  Negative for chest pain.  Gastrointestinal:  Positive for abdominal pain and nausea. Negative for constipation, diarrhea and vomiting.  Genitourinary:  Negative for dysuria.  Musculoskeletal:  Negative for myalgias.  Skin:  Negative for rash.  Neurological:  Negative for dizziness.  Psychiatric/Behavioral:  Negative for depression.    Physical Exam BP 118/80   Pulse 72   Temp 98.3 F (36.8 C) (Oral)   Ht 5' 6" (1.676 m)   Wt 231 lb (104.8 kg)   LMP 04/20/2018 (Exact Date)   SpO2 98%   BMI 37.28 kg/m  CONSTITUTIONAL: No acute distress, well nourished. HEENT:  Normocephalic, atraumatic, extraocular motion intact. NECK: Trachea is midline, and there is no jugular venous distension.  RESPIRATORY:  Normal respiratory effort without pathologic use of accessory muscles. CARDIOVASCULAR:  Regular rhythm and rate. GI: The abdomen is soft, non-distended, currently non-tender to palpation, but notes that her pain was worst in RUQ in the ED in the location of the gallbladder.  Negative Murphy's sign.  MUSCULOSKELETAL:  Normal muscle strength and tone in all four extremities.  No peripheral edema or cyanosis. SKIN: Skin turgor is normal. There are no pathologic skin lesions.  NEUROLOGIC:  Motor and sensation is grossly normal.  Cranial nerves are grossly intact. PSYCH:  Alert and oriented to person, place and time. Affect is normal.  Laboratory Analysis: Labs from 06/11/21: Na 136, K 3.3, Cl 103, CO2 28, BUN 20, Cr 0.89.  Total bili 0.8, AST 20, ALT 21, Alk Phos 64, lipase 42.  WBC 12.4, Hgb 14.8, Hct 45, Plt 219.  Imaging: U/S RUQ 06/11/21: IMPRESSION: Cholelithiasis.  No signs of acute cholecystitis.  Assessment and Plan: This is a 50 y.o. female with symptomatic cholelithiasis  --Discussed with the patient that more  likely her prior episode was also biliary colic rather than indigestion, and that sometimes the symptoms from these can be similar.  Discussed with her the laboratory and imaging findings.  Discussed the potential for conservative measures with low fat diet, but that this would not guarantee that she would not have further episodes.  She is interested in proceeding with surgical treatment.  Reviewed with her the planned surgery of robotic cholecystectomy and reviewed the risks of bleeding, infection, and injury to surrounding structures.  We would use ICG to better evaluate the biliary anatomy.  This would be an outpatient procedure.  Also reviewed with her the post-op recovery, pain control, and activity restrictions.  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  We're happy to fill out any paperwork needed. --Tentatively, we'll plan for robotic cholecystectomy on 06/25/21 in the morning.  All questions have been answered.  Face-to-face time spent with the patient and care providers was 60 minutes, with more than 50% of the time spent counseling, educating, and coordinating care of the patient.     Melvyn Neth, MD [facility]

## 2021-06-16 NOTE — H&P (View-Only) (Signed)
06/16/2021  Reason for Visit:  Symptomatic cholelithiasis  History of Present Illness: Casey Colon is a 50 y.o. female presenting for evaluation of symptomatic cholelithiasis.  Patient presented to the ED on 7/6 with a 1-day history of upper abdominal and RUQ pain, associated with nausea but no emesis.  She does report having some chills.  The patient had workup in the ED which showed only mildly elevated WBC of 12.4, with normal LFTs and lipase, and with an U/S which showed cholelithiasis without evidence of cholecystitis.  I have personally viewed the images and agree with the findings.  The patient's symptoms improved while she was in the ED and she was discharged with outpatient follow up.  She has not had pain issues since her discharge.  She does report that she had a similar though less severe episode about two months ago, which she had thought was indigestion.  Denies any fevers, current chills, chest pain, shortness of breath, nausea, vomiting.  Past Medical History: Past Medical History:  Diagnosis Date   Chronic headaches    Edema    GERD (gastroesophageal reflux disease)    IBS (irritable bowel syndrome)      Past Surgical History: Past Surgical History:  Procedure Laterality Date   ABDOMINAL HYSTERECTOMY     BREAST REDUCTION SURGERY Bilateral 04/24/2020   Procedure: BREAST REDUCTION WITH LIPOSUCTION;  Surgeon: Dillingham, Claire S, DO;  Location: [facility];  Service: Plastics;  Laterality: Bilateral;   BREAST SURGERY     LAPAROSCOPIC VAGINAL HYSTERECTOMY WITH SALPINGECTOMY Bilateral 05/09/2018   Procedure: LAPAROSCOPIC ASSISTED VAGINAL HYSTERECTOMY WITH BILATERAL  SALPINGECTOMY;  Surgeon: Defrancesco, Martin A, MD;  Location: [facility];  Service: Gynecology;  Laterality: Bilateral;    Home Medications: Prior to Admission medications   Medication Sig Start Date End Date Taking? Authorizing Provider  aspirin-acetaminophen-caffeine (EXCEDRIN MIGRAINE)  [phone] MG tablet Take 1 tablet by mouth every 6 (six) hours as needed for headache.   Yes [provider]  calcium carbonate (TUMS - DOSED IN MG ELEMENTAL CALCIUM) 500 MG chewable tablet Chew 1-2 tablets by mouth 2 (two) times daily as needed (for heartburn/indigestion.).    Yes [provider]  Cholecalciferol (VITAMIN D3) 2000 units TABS Take 2,000 Units by mouth daily.   Yes [provider]  fexofenadine (ALLEGRA) 180 MG tablet Take 180 mg by mouth daily as needed for allergies.   Yes [provider]  hydrOXYzine (VISTARIL) 25 MG capsule Take 1 capsule (25 mg total) by mouth 3 (three) times daily as needed. 08/16/19  Yes Sonnenberg, Eric G, MD  omeprazole (PRILOSEC) 20 MG capsule Take 20 mg by mouth daily.   Yes [provider]  ondansetron (ZOFRAN ODT) 4 MG disintegrating tablet Take 1 tablet (4 mg total) by mouth every 6 (six) hours as needed for nausea or vomiting. 06/11/21  Yes Ward, Kristen N, DO  phentermine 15 MG capsule Take 15 mg by mouth daily. 05/15/20  Yes [provider]  triamterene-hydrochlorothiazide (MAXZIDE-25) 37.5-25 MG tablet TAKE 1 TABLET BY MOUTH DAILY 11/18/20  Yes Sonnenberg, Eric G, MD  dicyclomine (BENTYL) 20 MG tablet Take 1 tablet (20 mg total) by mouth every 8 (eight) hours as needed for spasms (Abdominal cramping). Patient not taking: Reported on 06/16/2021 06/11/21   Ward, Kristen N, DO    Allergies: Allergies  Allergen Reactions   Dilaudid [Hydromorphone Hcl] Other (See Comments)    Chest heaviness/difficulty breathing   Zoloft [Sertraline] Other (See Comments)    anger      Social History:  reports that she has never smoked. She has never used smokeless tobacco. She reports that she does not drink alcohol and does not use drugs.   Family History: Family History  Problem Relation Age of Onset   Asthma Maternal Grandmother    Ovarian cancer Neg Hx    Colon cancer Neg Hx     Review of Systems: Review of  Systems  Constitutional:  Positive for chills. Negative for fever.  HENT:  Negative for hearing loss.   Respiratory:  Negative for shortness of breath.   Cardiovascular:  Negative for chest pain.  Gastrointestinal:  Positive for abdominal pain and nausea. Negative for constipation, diarrhea and vomiting.  Genitourinary:  Negative for dysuria.  Musculoskeletal:  Negative for myalgias.  Skin:  Negative for rash.  Neurological:  Negative for dizziness.  Psychiatric/Behavioral:  Negative for depression.    Physical Exam BP 118/80   Pulse 72   Temp 98.3 F (36.8 C) (Oral)   Ht 5' 6" (1.676 m)   Wt 231 lb (104.8 kg)   LMP 04/20/2018 (Exact Date)   SpO2 98%   BMI 37.28 kg/m  CONSTITUTIONAL: No acute distress, well nourished. HEENT:  Normocephalic, atraumatic, extraocular motion intact. NECK: Trachea is midline, and there is no jugular venous distension.  RESPIRATORY:  Normal respiratory effort without pathologic use of accessory muscles. CARDIOVASCULAR:  Regular rhythm and rate. GI: The abdomen is soft, non-distended, currently non-tender to palpation, but notes that her pain was worst in RUQ in the ED in the location of the gallbladder.  Negative Murphy's sign.  MUSCULOSKELETAL:  Normal muscle strength and tone in all four extremities.  No peripheral edema or cyanosis. SKIN: Skin turgor is normal. There are no pathologic skin lesions.  NEUROLOGIC:  Motor and sensation is grossly normal.  Cranial nerves are grossly intact. PSYCH:  Alert and oriented to person, place and time. Affect is normal.  Laboratory Analysis: Labs from 06/11/21: Na 136, K 3.3, Cl 103, CO2 28, BUN 20, Cr 0.89.  Total bili 0.8, AST 20, ALT 21, Alk Phos 64, lipase 42.  WBC 12.4, Hgb 14.8, Hct 45, Plt 219.  Imaging: U/S RUQ 06/11/21: IMPRESSION: Cholelithiasis.  No signs of acute cholecystitis.  Assessment and Plan: This is a 50 y.o. female with symptomatic cholelithiasis  --Discussed with the patient that more  likely her prior episode was also biliary colic rather than indigestion, and that sometimes the symptoms from these can be similar.  Discussed with her the laboratory and imaging findings.  Discussed the potential for conservative measures with low fat diet, but that this would not guarantee that she would not have further episodes.  She is interested in proceeding with surgical treatment.  Reviewed with her the planned surgery of robotic cholecystectomy and reviewed the risks of bleeding, infection, and injury to surrounding structures.  We would use ICG to better evaluate the biliary anatomy.  This would be an outpatient procedure.  Also reviewed with her the post-op recovery, pain control, and activity restrictions.  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  We're happy to fill out any paperwork needed. --Tentatively, we'll plan for robotic cholecystectomy on 06/25/21 in the morning.  All questions have been answered.  Face-to-face time spent with the patient and care providers was 60 minutes, with more than 50% of the time spent counseling, educating, and coordinating care of the patient.     Marcelline Temkin Luis Lindel Marcell, MD [facility]    

## 2021-06-19 ENCOUNTER — Other Ambulatory Visit: Payer: Self-pay | Admitting: Family Medicine

## 2021-06-23 ENCOUNTER — Other Ambulatory Visit: Payer: Self-pay

## 2021-06-23 ENCOUNTER — Encounter
Admission: RE | Admit: 2021-06-23 | Discharge: 2021-06-23 | Disposition: A | Discharge status: Home / Self Care | Payer: BC Managed Care – PPO | Source: Ambulatory Visit | Attending: Surgery | Admitting: Surgery

## 2021-06-23 HISTORY — DX: Nausea with vomiting, unspecified: R11.2

## 2021-06-23 HISTORY — DX: Other specified postprocedural states: Z98.890

## 2021-06-23 NOTE — Patient Instructions (Addendum)
Your procedure is scheduled on: Wednesday, July 20 Report to the Registration Desk on the 1st floor of the [facility]. To find out your arrival time, please call [phone] between 1PM - 3PM on: Tuesday, July 19  REMEMBER: Instructions that are not followed completely may result in serious medical risk, up to and including death; or upon the discretion of your surgeon and anesthesiologist your surgery may need to be rescheduled.  Do not eat food after midnight the night before surgery.  No gum chewing, lozengers or hard candies.  You may however, drink CLEAR liquids up to 2 hours before you are scheduled to arrive for your surgery. Do not drink anything within 2 hours of your scheduled arrival time.  Clear liquids include: - water  - apple juice without pulp - gatorade (not RED, PURPLE, OR BLUE) - black coffee or tea (Do NOT add milk or creamers to the coffee or tea) Do NOT drink anything that is not on this list.  TAKE THESE MEDICATIONS THE MORNING OF SURGERY WITH A SIP OF WATER:  Omeprazole (Prilosec) - (take one the night before and one on the morning of surgery - helps to prevent nausea after surgery.)  One week prior to surgery: Stop Anti-inflammatories (NSAIDS) such as Advil, Aleve, Ibuprofen, Motrin, Naproxen, Naprosyn and Aspirin based products such as Excedrin, Goodys Powder, BC Powder. Stop ANY OVER THE COUNTER supplements until after surgery. (Excedrin, tums, vitamin D, phentermine) You may however, continue to take Tylenol if needed for pain up until the day of surgery.  No Alcohol for 24 hours before or after surgery.  No Smoking including e-cigarettes for 24 hours prior to surgery.  No chewable tobacco products for at least 6 hours prior to surgery.  No nicotine patches on the day of surgery.  Do not use any "recreational" drugs for at least a week prior to your surgery.  Please be advised that the combination of cocaine and anesthesia may have negative  outcomes, up to and including death. If you test positive for cocaine, your surgery will be cancelled.  On the morning of surgery brush your teeth with toothpaste and water, you may rinse your mouth with mouthwash if you wish. Do not swallow any toothpaste or mouthwash.  Do not wear jewelry, make-up, hairpins, clips or nail polish.  Do not wear lotions, powders, or perfumes.   Do not shave body from the neck down 48 hours prior to surgery just in case you cut yourself which could leave a site for infection.   Do not bring valuables to the hospital. [facility] is not responsible for any missing/lost belongings or valuables.   Shower using antibacterial soap prior to coming to the hospital on surgery day.  Notify your doctor if there is any change in your medical condition (cold, fever, infection).  Wear comfortable clothing (specific to your surgery type) to the hospital.  After surgery, you can help prevent lung complications by doing breathing exercises.  Take deep breaths and cough every 1-2 hours. Your doctor may order a device called an Incentive Spirometer to help you take deep breaths. When coughing or sneezing, hold a pillow firmly against your incision with both hands. This is called "splinting." Doing this helps protect your incision. It also decreases belly discomfort.  If you are being discharged the day of surgery, you will not be allowed to drive home. You will need a responsible adult (18 years or older) to drive you home and stay with you that  night.   If you are taking public transportation, you will need to have a responsible adult (18 years or older) with you. Please confirm with your physician that it is acceptable to use public transportation.   Please call the [facility] at [phone] if you have any questions about these instructions.  Surgery Visitation Policy:  Patients undergoing a surgery or procedure may have one family member or  support person with them as long as that person is not COVID-19 positive or experiencing its symptoms.  That person may remain in the waiting area during the procedure.   Hypokalemia Hypokalemia means that the amount of potassium in the blood is lower than normal. Potassium is a chemical (electrolyte) that helps regulate the amount of fluid in the body. It also stimulates muscle tightening (contraction) and helps nerves work properly. Normally, most of the body's potassium is inside cells, and only a very small amount is in the blood. Because the amount in the blood is so small, minorchanges to potassium levels in the blood can be life-threatening. What are the causes? This condition may be caused by: Antibiotic medicine. Diarrhea or vomiting. Taking too much of a medicine that helps you have a bowel movement (laxative) can cause diarrhea and lead to hypokalemia. Chronic kidney disease (CKD). Medicines that help the body get rid of excess fluid (diuretics). Eating disorders, such as bulimia. Low magnesium levels in the body. Sweating a lot. What are the signs or symptoms? Symptoms of this condition include: Weakness. Constipation. Fatigue. Muscle cramps. Mental confusion. Skipped heartbeats or irregular heartbeat (palpitations). Tingling or numbness. How is this diagnosed? This condition is diagnosed with a blood test. How is this treated? This condition may be treated by: Taking potassium supplements by mouth. Adjusting the medicines that you take. Eating more foods that contain a lot of potassium. If your potassium level is very low, you may need to get potassium through anIV and be monitored in the hospital. Follow these instructions at home:  Take over-the-counter and prescription medicines only as told by your health care provider. This includes vitamins and supplements. Eat a healthy diet. A healthy diet includes fresh fruits and vegetables, whole grains, healthy fats, and  lean proteins. If instructed, eat more foods that contain a lot of potassium. This includes: Nuts, such as peanuts and pistachios. Seeds, such as sunflower seeds and pumpkin seeds. Peas, lentils, and lima beans. Whole grain and bran cereals and breads. Fresh fruits and vegetables, such as apricots, avocado, bananas, cantaloupe, kiwi, oranges, tomatoes, asparagus, and potatoes. Orange juice. Tomato juice. Red meats. Yogurt. Keep all follow-up visits as told by your health care provider. This is important. Contact a health care provider if you: Have weakness that gets worse. Feel your heart pounding or racing. Vomit. Have diarrhea. Have diabetes (diabetes mellitus) and you have trouble keeping your blood sugar (glucose) in your target range. Get help right away if you: Have chest pain. Have shortness of breath. Have vomiting or diarrhea that lasts for more than 2 days. Faint. Summary Hypokalemia means that the amount of potassium in the blood is lower than normal. This condition is diagnosed with a blood test. Hypokalemia may be treated by taking potassium supplements, adjusting the medicines that you take, or eating more foods that are high in potassium. If your potassium level is very low, you may need to get potassium through an IV and be monitored in the hospital. This information is not intended to replace advice given to you  by your health care provider. Make sure you discuss any questions you have with your healthcare provider. Document Revised: 07/06/2018 Document Reviewed: 07/06/2018 Elsevier Patient Education  [place].

## 2021-06-24 MED ORDER — CEFAZOLIN SODIUM-DEXTROSE 2-4 GM/100ML-% IV SOLN
2.0000 g | INTRAVENOUS | Status: AC
  Administered 2021-06-25: 2 g via INTRAVENOUS

## 2021-06-24 MED ORDER — APREPITANT 40 MG PO CAPS: 40.0000 mg | ORAL_CAPSULE | Freq: Once | ORAL | Status: AC

## 2021-06-24 MED ORDER — CHLORHEXIDINE GLUCONATE CLOTH 2 % EX PADS: 6.0000 | MEDICATED_PAD | Freq: Once | CUTANEOUS | Status: DC

## 2021-06-24 MED ORDER — LACTATED RINGERS IV SOLN: INTRAVENOUS | Status: DC

## 2021-06-24 MED ORDER — ORAL CARE MOUTH RINSE: 15.0000 mL | Freq: Once | OROMUCOSAL | Status: AC

## 2021-06-24 MED ORDER — ACETAMINOPHEN 500 MG PO TABS: 1000.0000 mg | ORAL_TABLET | ORAL | Status: AC

## 2021-06-24 MED ORDER — INDOCYANINE GREEN 25 MG IV SOLR
2.5000 mg | INTRAVENOUS | Status: AC
  Administered 2021-06-25: 2.5 mg via INTRAVENOUS
  Filled 2021-06-24: qty 1

## 2021-06-24 MED ORDER — GABAPENTIN 300 MG PO CAPS: 300.0000 mg | ORAL_CAPSULE | ORAL | Status: AC

## 2021-06-24 MED ORDER — CHLORHEXIDINE GLUCONATE 0.12 % MT SOLN: 15.0000 mL | Freq: Once | OROMUCOSAL | Status: AC

## 2021-06-25 ENCOUNTER — Telehealth: Payer: Self-pay | Admitting: *Deleted

## 2021-06-25 ENCOUNTER — Ambulatory Visit: Payer: BC Managed Care – PPO | Admitting: Surgery

## 2021-06-25 ENCOUNTER — Encounter
Admission: RE | Disposition: A | Discharge status: Home / Self Care | Payer: Self-pay | Source: Home / Self Care | Attending: Surgery

## 2021-06-25 ENCOUNTER — Encounter: Payer: Self-pay | Admitting: Surgery

## 2021-06-25 ENCOUNTER — Ambulatory Visit: Payer: BC Managed Care – PPO | Admitting: Anesthesiology

## 2021-06-25 ENCOUNTER — Other Ambulatory Visit: Payer: Self-pay

## 2021-06-25 ENCOUNTER — Ambulatory Visit
Admission: RE | Admit: 2021-06-25 | Discharge: 2021-06-25 | Disposition: A | Discharge status: Home / Self Care | Payer: BC Managed Care – PPO | Attending: Surgery | Admitting: Surgery

## 2021-06-25 DIAGNOSIS — K8020 Calculus of gallbladder without cholecystitis without obstruction: Secondary | ICD-10-CM

## 2021-06-25 DIAGNOSIS — K8010 Calculus of gallbladder with chronic cholecystitis without obstruction: Secondary | ICD-10-CM | POA: Diagnosis not present

## 2021-06-25 DIAGNOSIS — Z885 Allergy status to narcotic agent status: Secondary | ICD-10-CM | POA: Insufficient documentation

## 2021-06-25 SURGERY — CHOLECYSTECTOMY, ROBOT-ASSISTED, LAPAROSCOPIC
Anesthesia: General

## 2021-06-25 MED ORDER — EPHEDRINE 5 MG/ML INJ
INTRAVENOUS | Status: AC
  Filled 2021-06-25: qty 10

## 2021-06-25 MED ORDER — GABAPENTIN 300 MG PO CAPS
ORAL_CAPSULE | ORAL | Status: AC
  Administered 2021-06-25: 300 mg via ORAL
  Filled 2021-06-25: qty 1

## 2021-06-25 MED ORDER — ONDANSETRON 4 MG PO TBDP: 4.0000 mg | ORAL_TABLET | Freq: Four times a day (QID) | ORAL | 0 refills | Status: AC | PRN

## 2021-06-25 MED ORDER — DEXAMETHASONE SODIUM PHOSPHATE 10 MG/ML IJ SOLN
INTRAMUSCULAR | Status: DC | PRN
Start: 2021-06-25 — End: 2021-06-25
  Administered 2021-06-25: 10 mg via INTRAVENOUS

## 2021-06-25 MED ORDER — KETOROLAC TROMETHAMINE 30 MG/ML IJ SOLN
INTRAMUSCULAR | Status: DC | PRN
  Administered 2021-06-25: 30 mg via INTRAVENOUS

## 2021-06-25 MED ORDER — MIDAZOLAM HCL 2 MG/2ML IJ SOLN
INTRAMUSCULAR | Status: AC
  Filled 2021-06-25: qty 2

## 2021-06-25 MED ORDER — PHENYLEPHRINE HCL (PRESSORS) 10 MG/ML IV SOLN
INTRAVENOUS | Status: DC | PRN
  Administered 2021-06-25: 100 ug via INTRAVENOUS

## 2021-06-25 MED ORDER — SCOPOLAMINE 1 MG/3DAYS TD PT72
1.0000 | MEDICATED_PATCH | Freq: Once | TRANSDERMAL | Status: DC
  Administered 2021-06-25: 1.5 mg via TRANSDERMAL

## 2021-06-25 MED ORDER — FENTANYL CITRATE (PF) 100 MCG/2ML IJ SOLN
INTRAMUSCULAR | Status: DC | PRN
  Administered 2021-06-25 (×3): 50 ug via INTRAVENOUS

## 2021-06-25 MED ORDER — LIDOCAINE HCL (CARDIAC) PF 100 MG/5ML IV SOSY
PREFILLED_SYRINGE | INTRAVENOUS | Status: DC | PRN
  Administered 2021-06-25: 100 mg via INTRAVENOUS

## 2021-06-25 MED ORDER — PROPOFOL 10 MG/ML IV BOLUS
INTRAVENOUS | Status: DC | PRN
  Administered 2021-06-25: 180 mg via INTRAVENOUS

## 2021-06-25 MED ORDER — FENTANYL CITRATE (PF) 100 MCG/2ML IJ SOLN
INTRAMUSCULAR | Status: AC
  Filled 2021-06-25: qty 2

## 2021-06-25 MED ORDER — OXYCODONE HCL 5 MG PO TABS
5.0000 mg | ORAL_TABLET | Freq: Once | ORAL | Status: AC
  Administered 2021-06-25: 5 mg via ORAL

## 2021-06-25 MED ORDER — ONDANSETRON HCL 4 MG/2ML IJ SOLN: 4.0000 mg | Freq: Once | INTRAMUSCULAR | Status: DC | PRN

## 2021-06-25 MED ORDER — MEPERIDINE HCL 25 MG/ML IJ SOLN: 6.2500 mg | INTRAMUSCULAR | Status: DC | PRN

## 2021-06-25 MED ORDER — CHLORHEXIDINE GLUCONATE 0.12 % MT SOLN
OROMUCOSAL | Status: AC
  Administered 2021-06-25: 15 mL via OROMUCOSAL
  Filled 2021-06-25: qty 15

## 2021-06-25 MED ORDER — BUPIVACAINE-EPINEPHRINE (PF) 0.25% -1:200000 IJ SOLN
INTRAMUSCULAR | Status: DC | PRN
  Administered 2021-06-25: 30 mL

## 2021-06-25 MED ORDER — CEFAZOLIN SODIUM-DEXTROSE 2-4 GM/100ML-% IV SOLN
INTRAVENOUS | Status: AC
  Filled 2021-06-25: qty 100

## 2021-06-25 MED ORDER — FENTANYL CITRATE (PF) 100 MCG/2ML IJ SOLN: 25.0000 ug | INTRAMUSCULAR | Status: DC | PRN

## 2021-06-25 MED ORDER — ACETAMINOPHEN 500 MG PO TABS: 1000.0000 mg | ORAL_TABLET | Freq: Four times a day (QID) | ORAL | Status: AC | PRN

## 2021-06-25 MED ORDER — ROCURONIUM BROMIDE 100 MG/10ML IV SOLN
INTRAVENOUS | Status: DC | PRN
  Administered 2021-06-25: 50 mg via INTRAVENOUS
  Administered 2021-06-25: 20 mg via INTRAVENOUS
  Administered 2021-06-25: 10 mg via INTRAVENOUS

## 2021-06-25 MED ORDER — OXYCODONE HCL 5 MG PO TABS: 5.0000 mg | ORAL_TABLET | ORAL | 0 refills | Status: DC | PRN

## 2021-06-25 MED ORDER — GLYCOPYRROLATE 0.2 MG/ML IJ SOLN
INTRAMUSCULAR | Status: AC
  Filled 2021-06-25: qty 1

## 2021-06-25 MED ORDER — PHENYLEPHRINE HCL (PRESSORS) 10 MG/ML IV SOLN
INTRAVENOUS | Status: AC
  Filled 2021-06-25: qty 1

## 2021-06-25 MED ORDER — 0.9 % SODIUM CHLORIDE (POUR BTL) OPTIME
TOPICAL | Status: DC | PRN
  Administered 2021-06-25: 500 mL

## 2021-06-25 MED ORDER — ACETAMINOPHEN 500 MG PO TABS
ORAL_TABLET | ORAL | Status: AC
  Administered 2021-06-25: 1000 mg via ORAL
  Filled 2021-06-25: qty 2

## 2021-06-25 MED ORDER — IBUPROFEN 600 MG PO TABS: 600.0000 mg | ORAL_TABLET | Freq: Three times a day (TID) | ORAL | 1 refills | Status: AC | PRN

## 2021-06-25 MED ORDER — OXYCODONE HCL 5 MG PO TABS
ORAL_TABLET | ORAL | Status: AC
  Filled 2021-06-25: qty 1

## 2021-06-25 MED ORDER — SUGAMMADEX SODIUM 200 MG/2ML IV SOLN
INTRAVENOUS | Status: DC | PRN
  Administered 2021-06-25: 199.6 mg via INTRAVENOUS

## 2021-06-25 MED ORDER — ONDANSETRON HCL 4 MG/2ML IJ SOLN
INTRAMUSCULAR | Status: DC | PRN
  Administered 2021-06-25 (×2): 4 mg via INTRAVENOUS

## 2021-06-25 MED ORDER — APREPITANT 40 MG PO CAPS
ORAL_CAPSULE | ORAL | Status: AC
  Administered 2021-06-25: 40 mg via ORAL
  Filled 2021-06-25: qty 1

## 2021-06-25 MED ORDER — SUCCINYLCHOLINE CHLORIDE 20 MG/ML IJ SOLN
INTRAMUSCULAR | Status: DC | PRN
  Administered 2021-06-25: 120 mg via INTRAVENOUS

## 2021-06-25 MED ORDER — BUPIVACAINE-EPINEPHRINE (PF) 0.25% -1:200000 IJ SOLN
INTRAMUSCULAR | Status: AC
  Filled 2021-06-25: qty 30

## 2021-06-25 MED ORDER — MIDAZOLAM HCL 2 MG/2ML IJ SOLN
INTRAMUSCULAR | Status: DC | PRN
  Administered 2021-06-25: 2 mg via INTRAVENOUS

## 2021-06-25 MED ORDER — PROPOFOL 10 MG/ML IV BOLUS
INTRAVENOUS | Status: AC
  Filled 2021-06-25: qty 20

## 2021-06-25 MED ORDER — SCOPOLAMINE 1 MG/3DAYS TD PT72
MEDICATED_PATCH | TRANSDERMAL | Status: AC
  Filled 2021-06-25: qty 1

## 2021-06-25 SURGICAL SUPPLY — 57 items
ADH SKN CLS APL DERMABOND .7 (GAUZE/BANDAGES/DRESSINGS) ×1
APL PRP STRL LF DISP 70% ISPRP (MISCELLANEOUS) ×1
BAG INFUSER PRESSURE 100CC (MISCELLANEOUS) IMPLANT
BAG SPEC RTRVL LRG 6X4 10 (ENDOMECHANICALS)
CANNULA REDUC XI 12-8 STAPL (CANNULA) ×1
CANNULA REDUCER 12-8 DVNC XI (CANNULA) ×1 IMPLANT
CHLORAPREP W/TINT 26 (MISCELLANEOUS) ×2 IMPLANT
CLIP VESOLOCK MED LG 6/CT (CLIP) ×2 IMPLANT
CUP MEDICINE 2OZ PLAST GRAD ST (MISCELLANEOUS) ×2 IMPLANT
DECANTER SPIKE VIAL GLASS SM (MISCELLANEOUS) ×2 IMPLANT
DEFOGGER SCOPE WARMER CLEARIFY (MISCELLANEOUS) ×2 IMPLANT
DERMABOND ADVANCED (GAUZE/BANDAGES/DRESSINGS) ×1
DERMABOND ADVANCED .7 DNX12 (GAUZE/BANDAGES/DRESSINGS) ×1 IMPLANT
DRAPE ARM DVNC X/XI (DISPOSABLE) ×4 IMPLANT
DRAPE COLUMN DVNC XI (DISPOSABLE) ×1 IMPLANT
DRAPE DA VINCI XI ARM (DISPOSABLE) ×4
DRAPE DA VINCI XI COLUMN (DISPOSABLE) ×1
ELECT CAUTERY BLADE TIP 2.5 (TIP) ×2
ELECT REM PT RETURN 9FT ADLT (ELECTROSURGICAL) ×2
ELECTRODE CAUTERY BLDE TIP 2.5 (TIP) ×1 IMPLANT
ELECTRODE REM PT RTRN 9FT ADLT (ELECTROSURGICAL) ×1 IMPLANT
GAUZE 4X4 16PLY ~~LOC~~+RFID DBL (SPONGE) ×2 IMPLANT
GLOVE SURG SYN 7.0 (GLOVE) ×4 IMPLANT
GLOVE SURG SYN 7.5  E (GLOVE) ×2
GLOVE SURG SYN 7.5 E (GLOVE) ×2 IMPLANT
GOWN STRL REUS W/ TWL LRG LVL3 (GOWN DISPOSABLE) ×4 IMPLANT
GOWN STRL REUS W/TWL LRG LVL3 (GOWN DISPOSABLE) ×8
IRRIGATOR SUCT 8 DISP DVNC XI (IRRIGATION / IRRIGATOR) IMPLANT
IRRIGATOR SUCTION 8MM XI DISP (IRRIGATION / IRRIGATOR)
IV NS 1000ML (IV SOLUTION)
IV NS 1000ML BAXH (IV SOLUTION) IMPLANT
KIT PINK PAD W/HEAD ARE REST (MISCELLANEOUS) ×2
KIT PINK PAD W/HEAD ARM REST (MISCELLANEOUS) ×1 IMPLANT
LABEL OR SOLS (LABEL) ×2 IMPLANT
MANIFOLD NEPTUNE II (INSTRUMENTS) ×2 IMPLANT
NEEDLE HYPO 22GX1.5 SAFETY (NEEDLE) ×2 IMPLANT
NS IRRIG 500ML POUR BTL (IV SOLUTION) ×2 IMPLANT
OBTURATOR OPTICAL STANDARD 8MM (TROCAR) ×1
OBTURATOR OPTICAL STND 8 DVNC (TROCAR) ×1
OBTURATOR OPTICALSTD 8 DVNC (TROCAR) ×1 IMPLANT
PACK LAP CHOLECYSTECTOMY (MISCELLANEOUS) ×2 IMPLANT
PENCIL ELECTRO HAND CTR (MISCELLANEOUS) ×2 IMPLANT
POUCH SPECIMEN RETRIEVAL 10MM (ENDOMECHANICALS) IMPLANT
SEAL CANN UNIV 5-8 DVNC XI (MISCELLANEOUS) ×3 IMPLANT
SEAL XI 5MM-8MM UNIVERSAL (MISCELLANEOUS) ×3
SET TUBE SMOKE EVAC HIGH FLOW (TUBING) ×2 IMPLANT
SOLUTION ELECTROLUBE (MISCELLANEOUS) IMPLANT
SPONGE T-LAP 18X18 ~~LOC~~+RFID (SPONGE) IMPLANT
SPONGE T-LAP 4X18 ~~LOC~~+RFID (SPONGE) ×2 IMPLANT
STAPLER CANNULA SEAL DVNC XI (STAPLE) ×1 IMPLANT
STAPLER CANNULA SEAL XI (STAPLE) ×1
SUT MNCRL AB 4-0 PS2 18 (SUTURE) ×2 IMPLANT
SUT VIC AB 3-0 SH 27 (SUTURE)
SUT VIC AB 3-0 SH 27X BRD (SUTURE) IMPLANT
SUT VICRYL 0 AB UR-6 (SUTURE) ×4 IMPLANT
TAPE TRANSPORE STRL 2 31045 (GAUZE/BANDAGES/DRESSINGS) ×2 IMPLANT
TROCAR BALLN GELPORT 12X130M (ENDOMECHANICALS) ×2 IMPLANT

## 2021-06-25 NOTE — Anesthesia Preprocedure Evaluation (Signed)
Anesthesia Evaluation  Patient identified by MRN, date of birth, ID band Patient awake    Reviewed: Allergy & Precautions, NPO status , Patient's Chart, lab work & pertinent test results  History of Anesthesia Complications (+) PONV and history of anesthetic complications  Airway Mallampati: II  TM Distance: >3 FB Neck ROM: Full    Dental no notable dental hx. (+) Teeth Intact, Dental Advisory Given   Pulmonary neg pulmonary ROS,    Pulmonary exam normal breath sounds clear to auscultation       Cardiovascular negative cardio ROS Normal cardiovascular exam Rhythm:Regular Rate:Normal     Neuro/Psych  Headaches, PSYCHIATRIC DISORDERS Anxiety    GI/Hepatic Neg liver ROS, GERD  Medicated and Controlled,  Endo/Other    Renal/GU negative Renal ROS  negative genitourinary   Musculoskeletal negative musculoskeletal ROS (+)   Abdominal (+) + obese,   Peds  Hematology negative hematology ROS (+)   Anesthesia Other Findings   Reproductive/Obstetrics                            Anesthesia Physical  Anesthesia Plan  ASA: 2  Anesthesia Plan: General   Post-op Pain Management:    Induction: Intravenous  PONV Risk Score and Plan: 3 and Midazolam, Dexamethasone, Ondansetron and Scopolamine patch - Pre-op  Airway Management Planned: Oral ETT and LMA  Additional Equipment:   Intra-op Plan:   Post-operative Plan: Extubation in OR  Informed Consent: I have reviewed the patients History and Physical, chart, labs and discussed the procedure including the risks, benefits and alternatives for the proposed anesthesia with the patient or authorized representative who has indicated his/her understanding and acceptance.     Dental advisory given  Plan Discussed with: CRNA, Anesthesiologist and Surgeon  Anesthesia Plan Comments:        Anesthesia Quick Evaluation

## 2021-06-25 NOTE — Anesthesia Postprocedure Evaluation (Signed)
Anesthesia Post Note  Patient: Casey Colon  Procedure(s) Performed: XI ROBOTIC ASSISTED LAPAROSCOPIC CHOLECYSTECTOMY INDOCYANINE GREEN FLUORESCENCE IMAGING (ICG)  Patient location during evaluation: PACU Anesthesia Type: General Level of consciousness: awake and alert, awake and oriented Pain management: pain level controlled Vital Signs Assessment: post-procedure vital signs reviewed and stable Respiratory status: spontaneous breathing, nonlabored ventilation and respiratory function stable Cardiovascular status: blood pressure returned to baseline and stable Postop Assessment: no apparent nausea or vomiting Anesthetic complications: no   No notable events documented.   Last Vitals:  Vitals:   06/25/21 1115 06/25/21 1128  BP: (!) 123/46 (!) 104/59  Pulse: 68 62  Resp: 16 16  Temp: (!) 36.3 C (!) 36.1 C  SpO2: 98% 96%    Last Pain:  Vitals:   06/25/21 1128  TempSrc: Temporal  PainSc: 2                  Phill Mutter

## 2021-06-25 NOTE — Transfer of Care (Signed)
Immediate Anesthesia Transfer of Care Note  Patient: Casey Colon  Procedure(s) Performed: XI ROBOTIC ASSISTED LAPAROSCOPIC CHOLECYSTECTOMY INDOCYANINE GREEN FLUORESCENCE IMAGING (ICG)  Patient Location: PACU  Anesthesia Type:General  Level of Consciousness: sedated  Airway & Oxygen Therapy: Patient Spontanous Breathing and Patient connected to face mask oxygen  Post-op Assessment: Report given to RN and Post -op Vital signs reviewed and stable  Post vital signs: Reviewed and stable  Last Vitals:  Vitals Value Taken Time  BP    Temp    Pulse 82 06/25/21 1031  Resp    SpO2 100 % 06/25/21 1031  Vitals shown include unvalidated device data.  Last Pain:  Vitals:   06/25/21 0803  TempSrc: Oral  PainSc: 0-No pain         Complications: No notable events documented.

## 2021-06-25 NOTE — Interval H&P Note (Signed)
History and Physical Interval Note:  06/25/2021 8:02 AM  Casey Colon  has presented today for surgery, with the diagnosis of symptomatic cholelithiasis.  The various methods of treatment have been discussed with the patient and family. After consideration of risks, benefits and other options for treatment, the patient has consented to  Procedure(s): XI ROBOTIC ASSISTED LAPAROSCOPIC CHOLECYSTECTOMY (N/A) Viola (ICG) (N/A) as a surgical intervention.  The patient's history has been reviewed, patient examined, no change in status, stable for surgery.  I have reviewed the patient's chart and labs.  Questions were answered to the patient's satisfaction.     Manika Hast

## 2021-06-25 NOTE — Telephone Encounter (Signed)
Faxed FMLA to [employer] [place] [phone]

## 2021-06-25 NOTE — Op Note (Signed)
  Procedure Date:  06/25/2021  Pre-operative Diagnosis:  Symptomatic cholelithiasis  Post-operative Diagnosis:  Symptomatic cholelithiasis  Procedure:  Robotic assisted cholecystectomy with ICG FireFly cholangiogram  Surgeon:  Melvyn Neth, MD  Assistant:  Tomasa Hose, PA-S  Anesthesia:  General endotracheal  Estimated Blood Loss:  10 ml  Specimens:  gallbladder  Complications:  None  Indications for Procedure:  This is a 50 y.o. female who presents with abdominal pain and workup revealing symptomatic cholelithiasis.  The benefits, complications, treatment options, and expected outcomes were discussed with the patient. The risks of bleeding, infection, recurrence of symptoms, failure to resolve symptoms, bile duct damage, bile duct leak, retained common bile duct stone, bowel injury, and need for further procedures were all discussed with the patient and she was willing to proceed.  Description of Procedure: The patient was correctly identified in the preoperative area and brought into the operating room.  The patient was placed supine with VTE prophylaxis in place.  Appropriate time-outs were performed.  Anesthesia was induced and the patient was intubated.  Appropriate antibiotics were infused.  The abdomen was prepped and draped in a sterile fashion. An infraumbilical incision was made. A cutdown technique was used to enter the abdominal cavity without injury, and a 12 mm robotic port was inserted.  Pneumoperitoneum was obtained with appropriate opening pressures.  Three 8-mm ports were placed in the mid abdomen at the level of the umbilicus under direct visualization.  The DaVinci platform was docked, camera targeted, and instruments were placed under direct visualization.  The gallbladder was identified.  The fundus was grasped and retracted cephalad.  Adhesions were lysed bluntly and with electrocautery. The infundibulum was grasped and retracted laterally, exposing the  peritoneum overlying the gallbladder.  This was incised with electrocautery and extended on either side of the gallbladder.  FireFly cholangiogram was then obtained, and we were able to clearly identify the cystic duct and common bile duct.  The cystic duct and cystic artery were carefully dissected with combination of cautery and blunt dissection.  Both were clipped twice proximally and once distally, cutting in between.  The gallbladder was taken from the gallbladder fossa in a retrograde fashion with electrocautery. The gallbladder was placed in an Endocatch bag. The liver bed was inspected and any bleeding was controlled with electrocautery. The right upper quadrant was then inspected again revealing intact clips, no bleeding, and no ductal injury.  The 8 mm ports were removed under direct visualization and the 12 mm port was removed.  The Endocatch bag was brought out via the umbilical incision. The fascial opening was closed using 0 vicryl sutures.  Local anesthetic was infused in all incisions and the incisions were closed with 4-0 Monocryl.  The wounds were cleaned and sealed with DermaBond.  The patient was emerged from anesthesia and extubated and brought to the recovery room for further management.  The patient tolerated the procedure well and all counts were correct at the end of the case.   Melvyn Neth, MD

## 2021-06-25 NOTE — Anesthesia Procedure Notes (Signed)
Procedure Name: Intubation Date/Time: 06/25/2021 8:33 AM Performed by: Nelda Marseille, CRNA Pre-anesthesia Checklist: Patient identified, Patient being monitored, Timeout performed, Emergency Drugs available and Suction available Patient Re-evaluated:Patient Re-evaluated prior to induction Oxygen Delivery Method: Circle system utilized Preoxygenation: Pre-oxygenation with 100% oxygen Induction Type: IV induction Ventilation: Mask ventilation without difficulty Laryngoscope Size: Mac, 3 and McGraph Grade View: Grade I Tube type: Oral Tube size: 7.0 mm Number of attempts: 1 Airway Equipment and Method: Stylet Placement Confirmation: ETT inserted through vocal cords under direct vision, positive ETCO2 and breath sounds checked- equal and bilateral Secured at: 21 cm Tube secured with: Tape Dental Injury: Teeth and Oropharynx as per pre-operative assessment

## 2021-06-25 NOTE — Discharge Instructions (Signed)

## 2021-06-26 LAB — SURGICAL PATHOLOGY

## 2021-07-09 ENCOUNTER — Encounter: Payer: Self-pay | Admitting: Surgery

## 2021-07-09 ENCOUNTER — Other Ambulatory Visit: Payer: Self-pay

## 2021-07-09 ENCOUNTER — Ambulatory Visit (INDEPENDENT_AMBULATORY_CARE_PROVIDER_SITE_OTHER): Payer: BC Managed Care – PPO | Admitting: Surgery

## 2021-07-09 VITALS — BP 116/84 | HR 79 | Temp 98.6°F | Ht 66.0 in | Wt 226.0 lb

## 2021-07-09 DIAGNOSIS — K8020 Calculus of gallbladder without cholecystitis without obstruction: Secondary | ICD-10-CM

## 2021-07-09 DIAGNOSIS — Z09 Encounter for follow-up examination after completed treatment for conditions other than malignant neoplasm: Secondary | ICD-10-CM

## 2021-07-09 NOTE — Patient Instructions (Addendum)
You may try more fiber and drink plenty of water to help with diarrhea.   If you have any concerns or questions, please feel free to call our office. Follow up as needed.    GENERAL POST-OPERATIVE PATIENT INSTRUCTIONS   WOUND CARE INSTRUCTIONS:  Keep a dry clean dressing on the wound if there is drainage. The initial bandage may be removed after 24 hours.  Once the wound has quit draining you may leave it open to air.  If clothing rubs against the wound or causes irritation and the wound is not draining you may cover it with a dry dressing during the daytime.  Try to keep the wound dry and avoid ointments on the wound unless directed to do so.  If the wound becomes bright red and painful or starts to drain infected material that is not clear, please contact your physician immediately.  If the wound is mildly pink and has a thick firm ridge underneath it, this is normal, and is referred to as a healing ridge.  This will resolve over the next 4-6 weeks.  BATHING: You may shower if you have been informed of this by your surgeon. However, Please do not submerge in a tub, hot tub, or pool until incisions are completely sealed or have been told by your surgeon that you may do so.  DIET:  You may eat any foods that you can tolerate.  It is a good idea to eat a high fiber diet and take in plenty of fluids to prevent constipation.  If you do become constipated you may want to take a mild laxative or take ducolax tablets on a daily basis until your bowel habits are regular.  Constipation can be very uncomfortable, along with straining, after recent surgery.  ACTIVITY:  You are encouraged to cough and deep breath or use your incentive spirometer if you were given one, every 15-30 minutes when awake.  This will help prevent respiratory complications and low grade fevers post-operatively if you had a general anesthetic.  You may want to hug a pillow when coughing and sneezing to add additional support to the  surgical area, if you had abdominal or chest surgery, which will decrease pain during these times.  You are encouraged to walk and engage in light activity for the next two weeks.  You should not lift more than 20 pounds, until 07/23/2021 as it could put you at increased risk for complications.  Twenty pounds is roughly equivalent to a plastic bag of groceries. At that time- Listen to your body when lifting, if you have pain when lifting, stop and then try again in a few days. Soreness after doing exercises or activities of daily living is normal as you get back in to your normal routine.  MEDICATIONS:  Try to take narcotic medications and anti-inflammatory medications, such as tylenol, ibuprofen, naprosyn, etc., with food.  This will minimize stomach upset from the medication.  Should you develop nausea and vomiting from the pain medication, or develop a rash, please discontinue the medication and contact your physician.  You should not drive, make important decisions, or operate machinery when taking narcotic pain medication.  SUNBLOCK Use sun block to incision area over the next year if this area will be exposed to sun. This helps decrease scarring and will allow you avoid a permanent darkened area over your incision.    Gallbladder Eating Plan  If you have a gallbladder condition, you may have trouble digesting fats. Eating a low-fat  diet can help reduce your symptoms, and may be helpful before and after having surgery to remove your gallbladder (cholecystectomy). Your health care provider may recommend that you work with a diet and nutrition specialist (dietitian) to help you reduce the amount of fat in your diet. What are tips for following this plan? General guidelines Limit your fat intake to less than 30% of your total daily calories. If you eat around 1,800 calories each day, this is less than 60 grams (g) of fat per day. Fat is an important part of a healthy diet. Eating a low-fat diet can  make it hard to maintain a healthy body weight. Ask your dietitian how much fat, calories, and other nutrients you need each day. Eat small, frequent meals throughout the day instead of three large meals. Drink at least 8-10 cups of fluid a day. Drink enough fluid to keep your urine clear or pale yellow. Limit alcohol intake to no more than 1 drink a day for nonpregnant women and 2 drinks a day for men. One drink equals 12 oz of beer, 5 oz of wine, or 1 oz of hard liquor. Reading food labels  Check Nutrition Facts on food labels for the amount of fat per serving. Choose foods with less than 3 grams of fat per serving.  Shopping Choose nonfat and low-fat healthy foods. Look for the words "nonfat," "low fat," or "fat free." Avoid buying processed or prepackaged foods. Cooking Cook using low-fat methods, such as baking, broiling, grilling, or boiling. Cook with small amounts of healthy fats, such as olive oil, grapeseed oil, canola oil, or sunflower oil. What foods are recommended? All fresh, frozen, or canned fruits and vegetables. Whole grains. Low-fat or non-fat (skim) milk and yogurt. Lean meat, skinless poultry, fish, eggs, and beans. Low-fat protein supplement powders or drinks. Spices and herbs. What foods are not recommended? High-fat foods. These include baked goods, fast food, fatty cuts of meat, ice cream, french toast, sweet rolls, pizza, cheese bread, foods covered with butter, creamy sauces, or cheese. Fried foods. These include french fries, tempura, battered fish, breaded chicken, fried breads, and sweets. Foods with strong odors. Foods that cause bloating and gas. Summary A low-fat diet can be helpful if you have a gallbladder condition, or before and after gallbladder surgery. Limit your fat intake to less than 30% of your total daily calories. This is about 60 g of fat if you eat 1,800 calories each day. Eat small, frequent meals throughout the day instead of three large  meals. This information is not intended to replace advice given to you by your health care provider. Make sure you discuss any questions you have with your healthcare provider. Document Revised: 07/11/2020 Document Reviewed: 07/11/2020 Elsevier Patient Education  2022 [employer].

## 2021-07-09 NOTE — Progress Notes (Signed)
07/09/2021  HPI: CATLIN MCKIVER is a 50 y.o. female s/p robotic assisted cholecystectomy on 06/25/21.  She presents today for follow up.  Denies any nausea, vomiting, or worsening pain.  However, she's reporting some issues with diarrhea.  She's had issues with constipation in the past and she feels that sometimes she'll have diarrhea, and sometimes she'll have constipation.  Denies any issues with the incisions.  Vital signs: BP 116/84   Pulse 79   Temp 98.6 F (37 C) (Oral)   Ht '5\' 6"'$  (1.676 m)   Wt 226 lb (102.5 kg)   LMP 04/20/2018 (Exact Date)   SpO2 97%   BMI 36.48 kg/m    Physical Exam: Constitutional:  No acute distress Abdomen:  Soft, non-distended, non-tender to palpation.  Incisions healing well, clean, dry, intact.    Assessment/Plan: This is a 50 y.o. female s/p robotic assisted cholecystectomy  --Discussed with the patient that the diarrhea could be attributed to her not having a gallbladder anymore and that her body should be adjusting to this.  She could try taking fiber supplement such as Metamucil to help with her diarrhea episodes.  Would not recommend imodium given that she also has issues with constipation. --She may return to work on 07/23/21 with lighter duties and no further restrictions after 08/06/21. --Follow up as needed.  Discussed to call us if any worsening diarrhea, or any new pain or issues with the incisions.   Melvyn Neth, [facility]

## 2021-07-16 ENCOUNTER — Telehealth: Payer: Self-pay | Admitting: *Deleted

## 2021-07-16 NOTE — Telephone Encounter (Signed)
Faxed FMLA to claims department at [phone]

## 2021-07-27 ENCOUNTER — Other Ambulatory Visit: Payer: Self-pay

## 2021-07-27 ENCOUNTER — Ambulatory Visit
Admission: EM | Admit: 2021-07-27 | Discharge: 2021-07-27 | Disposition: A | Discharge status: Home / Self Care | Payer: BC Managed Care – PPO | Attending: Emergency Medicine | Admitting: Emergency Medicine

## 2021-07-27 DIAGNOSIS — M5441 Lumbago with sciatica, right side: Secondary | ICD-10-CM

## 2021-07-27 MED ORDER — METHYLPREDNISOLONE SODIUM SUCC 125 MG IJ SOLR
62.5000 mg | Freq: Once | INTRAMUSCULAR | Status: AC
  Administered 2021-07-27: 62.5 mg via INTRAMUSCULAR

## 2021-07-27 NOTE — ED Provider Notes (Signed)
Chief Complaint   Chief Complaint  Patient presents with   Back Pain     Subjective, HPI  Casey Colon is a 50 y.o. female who presents with right lower back pain that radiates down the right leg.  Patient reports that this started on Friday.  Patient reports no known injury, falls or trauma.  Patient reports a history of a slipped disc from an unknown level and states that she did have gallbladder surgery in July.  Patient reports that she has been treating the pain with Advil for which she took 800 mg at 3 AM, Tylenol, oxycodone and prednisone without much relief.  Patient is interested in a steroid injection as she reports that she does not want to take anything else orally.  She also reports that she did take a muscle relaxer at home on Friday, but states that this did not seem to help either. No bowel or bladder incontinence, urinary retention, or groin paresthesias. No fever or chills.  History obtained from patient.   Patient's problem list, past medical and social history, medications, and allergies were reviewed by me and updated in Epic.    ROS  See HPI.  Objective   Vitals:   07/27/21 0922  BP: 114/82  Pulse: 85  Resp: 16  Temp: 98.3 F (36.8 C)  SpO2: 98%     General: Appears well-developed and well-nourished. No acute distress.  Head: Normocephalic and atraumatic.   Neck: Normal range of motion, neck is supple.  Cardiovascular: Normal rate.  Pulm/Chest: No respiratory distress.  Musculoskeletal: Lumbar Spine: Moderate TTP with muscular tension noted to right lower back. No midline tenderness or step-off. BLE with 5/5 strength, full sensation. Gait is normal. Neurological: Alert and oriented to person, place, and time.  Skin: Skin is warm and dry.   Psychiatric: Normal mood, affect, behavior, and thought content.   Vital signs and nursing note reviewed.   Data  Imaging None needed.  No midline spinal tenderness.  Assessment & Plan  1. Acute right-sided  low back pain with right-sided sciatica - methylPREDNISolone sodium succinate (SOLU-MEDROL) 125 mg/2 mL injection 62.5 mg  50 y.o. female presents with right lower back pain that radiates down the right leg.  Patient reports that this started on Friday.  Patient reports no known injury, falls or trauma.  Patient reports a history of a slipped disc from an unknown level and states that she did have gallbladder surgery in July.  Patient reports that she has been treating the pain with Advil for which she took 800 mg at 3 AM, Tylenol, oxycodone and prednisone without much relief.  Patient is interested in a steroid injection as she reports that she does not want to take anything else orally.  She also reports that she did take a muscle relaxer at home on Friday, but states that this did not seem to help either. No bowel or bladder incontinence, urinary retention, or groin paresthesias. No fever or chills.  Chart review completed.  Patient received 62.5 mg of Solu-Medrol in clinic today for which she tolerated well.  Given symptoms along with assessment findings, likely right-sided low back pain with sciatica.  Advised to continue using medications at home as she seems to have access to several medications which may be helpful for this condition.  Advised following up with [facility] if she is not able to get in with her PCP within 1 week for recheck if her symptoms do not improve.  Patient verbalized understanding and  agreed with plan.  Patient stable upon discharge.  Return as needed.  Plan:   Discharge Instructions      Follow up with PCP to inform of visit and treatment in clinic today as orthopedic or PT referral may be needed if symptoms persist longer than 2-3 weeks.  Increase fluid intake. Apply ice to affected area 3-5 times daily for 15-20 minute intervals.  You may use heat first thing in the morning and last thing at night, otherwise use ice as directed. May take 1000 mg Tylenol OR 600 mg  ibuprofen every 8 hours as needed for pain as long as neither medication is contraindicated to your current health conditions. Pillow underneath knees at night to sleep on your back and/or pillow in between knees to sleep on side. You may use topical analgesics such as BenGay to help with muscle tension to right lower back. Return to clinic if symptoms worsen. If you experience any significant worsening of pain, bowel or bladder incontinence, groin paresthesias, fever or chills go to the ER.          Serafina Royals, [place] 07/27/21 1056

## 2021-07-27 NOTE — ED Triage Notes (Signed)
Patient presents to Urgent Care with complaints of right sided lower back pain that radiates to right lower leg since Friday. Pt states she also has a hx of slipped disk or if from her gall bladder surgery in July. Treating pain with Advil ('800mg'$  at 3am), tylenol, oxycodone, and prednisone.   Denies fever, abdominal pain, urinary symptoms, or injury.

## 2021-07-27 NOTE — Discharge Instructions (Addendum)
Follow up with PCP to inform of visit and treatment in clinic today as orthopedic or PT referral may be needed if symptoms persist longer than 2-3 weeks.  Increase fluid intake. Apply ice to affected area 3-5 times daily for 15-20 minute intervals.  You may use heat first thing in the morning and last thing at night, otherwise use ice as directed. May take 1000 mg Tylenol OR 600 mg ibuprofen every 8 hours as needed for pain as long as neither medication is contraindicated to your current health conditions. Pillow underneath knees at night to sleep on your back and/or pillow in between knees to sleep on side. You may use topical analgesics such as BenGay to help with muscle tension to right lower back. Return to clinic if symptoms worsen. If you experience any significant worsening of pain, bowel or bladder incontinence, groin paresthesias, fever or chills go to the ER.

## 2021-07-28 ENCOUNTER — Other Ambulatory Visit: Payer: Self-pay | Admitting: Rehabilitation

## 2021-07-28 DIAGNOSIS — M47816 Spondylosis without myelopathy or radiculopathy, lumbar region: Secondary | ICD-10-CM

## 2021-08-07 ENCOUNTER — Other Ambulatory Visit: Payer: BC Managed Care – PPO

## 2021-09-25 ENCOUNTER — Other Ambulatory Visit: Payer: Self-pay | Admitting: Family Medicine

## 2022-07-26 ENCOUNTER — Other Ambulatory Visit: Payer: Self-pay | Admitting: Family Medicine

## 2022-12-04 ENCOUNTER — Ambulatory Visit (INDEPENDENT_AMBULATORY_CARE_PROVIDER_SITE_OTHER): Payer: BC Managed Care – PPO

## 2022-12-04 ENCOUNTER — Ambulatory Visit (INDEPENDENT_AMBULATORY_CARE_PROVIDER_SITE_OTHER): Payer: BC Managed Care – PPO | Admitting: Podiatry

## 2022-12-04 DIAGNOSIS — M779 Enthesopathy, unspecified: Secondary | ICD-10-CM

## 2022-12-04 DIAGNOSIS — M722 Plantar fascial fibromatosis: Secondary | ICD-10-CM | POA: Diagnosis not present

## 2022-12-04 MED ORDER — BETAMETHASONE SOD PHOS & ACET 6 (3-3) MG/ML IJ SUSP
3.0000 mg | Freq: Once | INTRAMUSCULAR | Status: AC
  Administered 2022-12-04: 3 mg via INTRA_ARTICULAR

## 2022-12-04 MED ORDER — METHYLPREDNISOLONE 4 MG PO TBPK: ORAL_TABLET | ORAL | 0 refills | Status: AC

## 2022-12-04 NOTE — Progress Notes (Signed)
   Chief Complaint  Patient presents with   Foot Problem    right foot/heel pain/ bone spur on bottom of foot/ hx of plantar fasciitis    Subjective: 51 y.o. female presenting today for evaluation of pain and tenderness associated to the right plantar heel that has been ongoing since September 2023.  She was seen at [facility] who diagnosed her with a heel spur.  She says that no treatment was administered.  She did have some physical therapy sessions and change shoes which helped minimally.  Presenting for further treatment and evaluation   Past Medical History:  Diagnosis Date   Chronic headaches    Edema    GERD (gastroesophageal reflux disease)    IBS (irritable bowel syndrome)    PONV (postoperative nausea and vomiting)    Past Surgical History:  Procedure Laterality Date   BREAST REDUCTION SURGERY Bilateral 04/24/2020   Procedure: BREAST REDUCTION WITH LIPOSUCTION;  Surgeon: Wallace Going, DO;  Location: [facility];  Service: Plastics;  Laterality: Bilateral;   LAPAROSCOPIC VAGINAL HYSTERECTOMY WITH SALPINGECTOMY Bilateral 05/09/2018   Procedure: LAPAROSCOPIC ASSISTED VAGINAL HYSTERECTOMY WITH BILATERAL  SALPINGECTOMY;  Surgeon: [name], MD;  Location: [facility];  Service: Gynecology;  Laterality: Bilateral;   Allergies  Allergen Reactions   Dilaudid [Hydromorphone Hcl] Other (See Comments)    Chest heaviness/difficulty breathing   Zoloft [Sertraline] Other (See Comments)    anger     Objective: Physical Exam General: The patient is alert and oriented x3 in no acute distress.  Dermatology: Skin is warm, dry and supple bilateral lower extremities. Negative for open lesions or macerations bilateral.   Vascular: Dorsalis Pedis and Posterior Tibial pulses palpable bilateral.  Capillary fill time is immediate to all digits.  Neurological: Epicritic and protective threshold intact bilateral.   Musculoskeletal: Tenderness to palpation to  the plantar aspect of the right heel along the plantar fascia. All other joints range of motion within normal limits bilateral. Strength 5/5 in all groups bilateral.   Radiographic exam RT foot 12/04/2022: Normal osseous mineralization. Joint spaces preserved. No fracture/dislocation/boney destruction. No other soft tissue abnormalities or radiopaque foreign bodies.   Assessment: 1. Plantar fasciitis right  Plan of Care:  1. Patient evaluated. Xrays reviewed.   2. Injection of 0.5cc Celestone soluspan injected into the right plantar fascia  3. Rx for Medrol Dose Pack placed 4.  Patient has meloxicam at home but prefers Motrin. 5. Plantar fascial band(s) dispensed 6. Instructed patient regarding therapies and modalities at home to alleviate symptoms.  7.  Continue Brooks running shoes with OTC arch supports from [facility] in [place]  8.  Return to clinic in 4 weeks.    *Nurse at [facility]   Edrick Kins, DPM [facility]  Dr. Edrick Kins, DPM    [address], [place] [address]                Office [phone]  Fax [phone]

## 2023-01-12 ENCOUNTER — Encounter: Payer: BC Managed Care – PPO | Admitting: Podiatry

## 2023-02-09 ENCOUNTER — Encounter: Payer: BC Managed Care – PPO | Admitting: Podiatry

## 2023-02-23 ENCOUNTER — Encounter: Payer: BC Managed Care – PPO | Admitting: Podiatry

## 2023-07-10 ENCOUNTER — Other Ambulatory Visit: Payer: Self-pay | Admitting: Family Medicine

## 2023-07-13 NOTE — Telephone Encounter (Signed)
This patient has not been seen in 4 years. I am refusing this medication refill.

## 2024-06-23 ENCOUNTER — Ambulatory Visit: Admission: EM | Admit: 2024-06-23 | Discharge: 2024-06-23 | Disposition: A | Discharge status: Home / Self Care

## 2024-06-23 ENCOUNTER — Encounter: Payer: Self-pay | Admitting: Emergency Medicine

## 2024-06-23 DIAGNOSIS — J069 Acute upper respiratory infection, unspecified: Secondary | ICD-10-CM | POA: Diagnosis not present

## 2024-06-23 LAB — POCT RAPID STREP A (OFFICE): Rapid Strep A Screen: NEGATIVE

## 2024-06-23 MED ORDER — GUAIFENESIN-CODEINE 100-10 MG/5ML PO SOLN: 5.0000 mL | Freq: Four times a day (QID) | ORAL | 0 refills | Status: AC | PRN

## 2024-06-23 MED ORDER — AMOXICILLIN-POT CLAVULANATE 875-125 MG PO TABS
1.0000 | ORAL_TABLET | Freq: Two times a day (BID) | ORAL | 0 refills | Status: AC
Start: 2024-06-26 — End: ?

## 2024-06-23 MED ORDER — PREDNISONE 10 MG (21) PO TBPK
ORAL_TABLET | Freq: Every day | ORAL | 0 refills | Status: AC
Start: 2024-06-23 — End: ?

## 2024-06-23 NOTE — ED Provider Notes (Signed)
 CAY RALPH PELT    CSN: [id number] Arrival date & time: 06/23/24  9162      History   Chief Complaint Chief Complaint  Patient presents with   Cough   Nasal Congestion   Generalized Body Aches    HPI Casey Colon is a 53 y.o. female.   Patient presents for evaluation of chills, nasal congestion, productive cough with green sputum, sore throat, headaches and sinus pressure across the bridge of the nose present for 4 days.  Symptoms worsening becoming more prominent.  Home COVID test negative.  No known sick contacts but does work in the [employer], [employer] (consulting).  Has attempted use of Mucinex max and Mucinex DM, Tylenol , Advil  and salt water gargles.  Decreased appetite but tolerating food and liquids.  Past Medical History:  Diagnosis Date   Chronic headaches    Edema    GERD (gastroesophageal reflux disease)    IBS (irritable bowel syndrome)    PONV (postoperative nausea and vomiting)     Patient Active Problem List   Diagnosis Date Noted   Symptomatic cholelithiasis    Back pain 10/06/2019   Neck pain 10/06/2019   Left axillary pain 08/17/2019   Anxiety 08/17/2019   Symptomatic mammary hypertrophy 08/17/2019   Chronic right-sided low back pain without sciatica 05/03/2019   Status post LAVH bilateral salpingectomy 05/09/2018   Climacteric 04/06/2018   Irregular menses 03/01/2018   Chronic pain of right knee 03/01/2018   Encounter for general adult medical examination with abnormal findings 02/26/2017   GERD (gastroesophageal reflux disease) 01/06/2017   Vitamin D  deficiency 01/06/2017   Obesity 01/06/2017   IBS (irritable bowel syndrome) 01/06/2017   Bilateral lower extremity edema 01/06/2017    Past Surgical History:  Procedure Laterality Date   BREAST REDUCTION SURGERY Bilateral 04/24/2020   Procedure: BREAST REDUCTION WITH LIPOSUCTION;  Surgeon: Lowery Estefana RAMAN, DO;  Location: [facility];  Service: Plastics;  Laterality: Bilateral;    LAPAROSCOPIC VAGINAL HYSTERECTOMY WITH SALPINGECTOMY Bilateral 05/09/2018   Procedure: LAPAROSCOPIC ASSISTED VAGINAL HYSTERECTOMY WITH BILATERAL  SALPINGECTOMY;  Surgeon: Kathe Gladis LABOR, MD;  Location: [facility];  Service: Gynecology;  Laterality: Bilateral;    OB History     Gravida  1   Para      Term      Preterm      AB  1   Living         SAB      IAB  1   Ectopic      Multiple      Live Births               Home Medications    Prior to Admission medications   Medication Sig Start Date End Date Taking? Authorizing Provider  amoxicillin-clavulanate (AUGMENTIN) 875-125 MG tablet Take 1 tablet by mouth every 12 (twelve) hours. 06/26/24  Yes Lateia Fraser R, NP  guaiFENesin-codeine 100-10 MG/5ML syrup Take 5 mLs by mouth every 6 (six) hours as needed for cough. 06/23/24  Yes Tomara Youngberg R, NP  predniSONE (STERAPRED UNI-PAK 21 TAB) 10 MG (21) TBPK tablet Take by mouth daily. Take 6 tabs by mouth daily  for 1 days, then 5 tabs for 1 days, then 4 tabs for 1 days, then 3 tabs for 1 days, 2 tabs for 1 days, then 1 tab by mouth daily for 1 days 06/23/24  Yes Dakai Braithwaite R, NP  venlafaxine XR (EFFEXOR-XR) 37.5 MG 24 hr capsule Take 37.5 mg by  mouth daily with breakfast. 12/27/23 12/26/24 Yes [provider]  acetaminophen  (TYLENOL ) 500 MG tablet Take 2 tablets (1,000 mg total) by mouth every 6 (six) hours as needed for mild pain. 06/25/21   Desiderio Schanz, MD  aspirin-acetaminophen -caffeine (EXCEDRIN MIGRAINE) 250-250-65 MG tablet Take 1 tablet by mouth every 6 (six) hours as needed for headache.    [provider]  calcium carbonate (TUMS - DOSED IN MG ELEMENTAL CALCIUM) 500 MG chewable tablet Chew 1,000 mg by mouth 2 (two) times daily as needed (for heartburn/indigestion.).    [provider]  Cholecalciferol (VITAMIN D3) 2000 units TABS Take 2,000 Units by mouth daily.    [provider]  dicyclomine  (BENTYL ) 20 MG tablet Take  1 tablet (20 mg total) by mouth every 8 (eight) hours as needed for spasms (Abdominal cramping). 06/11/21   Ward, Josette SAILOR, DO  fexofenadine (ALLEGRA) 180 MG tablet Take 180 mg by mouth daily.    [provider]  ibuprofen  (ADVIL ) 600 MG tablet Take 1 tablet (600 mg total) by mouth every 8 (eight) hours as needed for moderate pain. 06/25/21   Desiderio Schanz, MD  methylPREDNISolone  (MEDROL  DOSEPAK) 4 MG TBPK tablet 6 day dose pack - take as directed 12/04/22   Janit Thresa HERO, DPM  omeprazole (PRILOSEC) 20 MG capsule Take 20 mg by mouth daily.    [provider]  ondansetron  (ZOFRAN  ODT) 4 MG disintegrating tablet Take 1 tablet (4 mg total) by mouth every 6 (six) hours as needed for nausea or vomiting. 06/25/21   Desiderio Schanz, MD  phentermine 15 MG capsule Take 15 mg by mouth daily. 05/15/20   [provider]  triamterene -hydrochlorothiazide (MAXZIDE-25) 37.5-25 MG tablet TAKE 1 TABLET BY MOUTH DAILY 07/26/22   Webb, Padonda B, FNP    Family History Family History  Problem Relation Age of Onset   Asthma Maternal Grandmother    Ovarian cancer Neg Hx    Colon cancer Neg Hx     Social History Social History   Tobacco Use   Smoking status: Never   Smokeless tobacco: Never  Vaping Use   Vaping status: Never Used  Substance Use Topics   Alcohol use: No   Drug use: No     Allergies   Dilaudid [hydromorphone hcl] and Zoloft [sertraline]   Review of Systems Review of Systems   Physical Exam Triage Vital Signs ED Triage Vitals  Encounter Vitals Group     BP 06/23/24 0931 115/84     Girls Systolic BP Percentile --      Girls Diastolic BP Percentile --      Boys Systolic BP Percentile --      Boys Diastolic BP Percentile --      Pulse Rate 06/23/24 0931 96     Resp 06/23/24 0931 18     Temp 06/23/24 0931 99.5 F (37.5 C)     Temp src --      SpO2 06/23/24 0931 95 %     Weight --      Height --      Head Circumference --      Peak Flow --      Pain  Score 06/23/24 0928 4     Pain Loc --      Pain Education --      Exclude from Growth Chart --    No data found.  Updated Vital Signs BP 115/84 (BP Location: Right Arm)   Pulse 96   Temp 99.5 F (  37.5 C)   Resp 18   LMP 04/20/2018 (Exact Date)   SpO2 95%   Visual Acuity Right Eye Distance:   Left Eye Distance:   Bilateral Distance:    Right Eye Near:   Left Eye Near:    Bilateral Near:     Physical Exam Constitutional:      Appearance: Normal appearance.  HENT:     Right Ear: Tympanic membrane, ear canal and external ear normal.     Left Ear: Tympanic membrane, ear canal and external ear normal.     Nose: Congestion present.     Mouth/Throat:     Pharynx: No oropharyngeal exudate or posterior oropharyngeal erythema.  Cardiovascular:     Rate and Rhythm: Normal rate and regular rhythm.     Pulses: Normal pulses.     Heart sounds: Normal heart sounds.  Pulmonary:     Effort: Pulmonary effort is normal.     Breath sounds: Normal breath sounds.  Neurological:     Mental Status: She is alert and oriented to person, place, and time. Mental status is at baseline.      UC Treatments / Results  Labs (all labs ordered are listed, but only abnormal results are displayed) Labs Reviewed  POCT RAPID STREP A (OFFICE) - Normal    EKG   Radiology No results found.  Procedures Procedures (including critical care time)  Medications Ordered in UC Medications - No data to display  Initial Impression / Assessment and Plan / UC Course  I have reviewed the triage vital signs and the nursing notes.  Pertinent labs & imaging results that were available during my care of the patient were reviewed by me and considered in my medical decision making (see chart for details).  Viral URI with cough  Patient is in no signs of distress nor toxic appearing.  Vital signs are stable.  Low suspicion for pneumonia, pneumothorax or bronchitis and therefore will defer imaging.  Rapid  strep test negative.  Prescribed prednisone and guaifenesin codeine, PDMP reviewed low risk, watch and wait antibiotic placed at pharmacy.May use additional over-the-counter medications as needed for supportive care.  May follow-up with urgent care as needed if symptoms persist or worsen.  Note given.   Final Clinical Impressions(s) / UC Diagnoses   Final diagnoses:  Viral URI with cough     Discharge Instructions      Your symptoms today are most likely being caused by a virus and should steadily improve in time it can take up to 7 to 10 days before you truly start to see a turnaround however things will get better, if no improvement seen by Monday may pick up Augmentin  Begin prednisone as directed, take with food, will help reduce sinus pressure and persistence of cough  May use cough syrup every 6 hours but please be mindful make you feel drowsy    You can take Tylenol   as needed for fever reduction and pain relief.   For cough: honey 1/2 to 1 teaspoon (you can dilute the honey in water or another fluid).  You can also use guaifenesin and dextromethorphan for cough. You can use a humidifier for chest congestion and cough.  If you don't have a humidifier, you can sit in the bathroom with the hot shower running.      For sore throat: try warm salt water gargles, cepacol lozenges, throat spray, warm tea or water with lemon/honey, popsicles or ice, or OTC cold relief medicine for throat  discomfort.   For congestion: take a daily anti-histamine like Zyrtec, Claritin, and a oral decongestant, such as pseudoephedrine.  You can also use Flonase 1-2 sprays in each nostril daily.   It is important to stay hydrated: drink plenty of fluids (water, gatorade/powerade/pedialyte, juices, or teas) to keep your throat moisturized and help further relieve irritation/discomfort.    ED Prescriptions     Medication Sig Dispense Auth. Provider   predniSONE (STERAPRED UNI-PAK 21 TAB) 10 MG (21) TBPK  tablet Take by mouth daily. Take 6 tabs by mouth daily  for 1 days, then 5 tabs for 1 days, then 4 tabs for 1 days, then 3 tabs for 1 days, 2 tabs for 1 days, then 1 tab by mouth daily for 1 days 21 tablet Rickita Forstner R, NP   guaiFENesin-codeine 100-10 MG/5ML syrup Take 5 mLs by mouth every 6 (six) hours as needed for cough. 120 mL Bich Mchaney R, NP   amoxicillin-clavulanate (AUGMENTIN) 875-125 MG tablet Take 1 tablet by mouth every 12 (twelve) hours. 14 tablet Tatyana Biber R, NP      I have reviewed the PDMP during this encounter.   Teresa Shelba SAUNDERS, NP 06/23/24 1039

## 2024-06-23 NOTE — Discharge Instructions (Addendum)
 Your symptoms today are most likely being caused by a virus and should steadily improve in time it can take up to 7 to 10 days before you truly start to see a turnaround however things will get better, if no improvement seen by Monday may pick up Augmentin  Begin prednisone as directed, take with food, will help reduce sinus pressure and persistence of cough  May use cough syrup every 6 hours but please be mindful make you feel drowsy    You can take Tylenol   as needed for fever reduction and pain relief.   For cough: honey 1/2 to 1 teaspoon (you can dilute the honey in water or another fluid).  You can also use guaifenesin and dextromethorphan for cough. You can use a humidifier for chest congestion and cough.  If you don't have a humidifier, you can sit in the bathroom with the hot shower running.      For sore throat: try warm salt water gargles, cepacol lozenges, throat spray, warm tea or water with lemon/honey, popsicles or ice, or OTC cold relief medicine for throat discomfort.   For congestion: take a daily anti-histamine like Zyrtec, Claritin, and a oral decongestant, such as pseudoephedrine.  You can also use Flonase 1-2 sprays in each nostril daily.   It is important to stay hydrated: drink plenty of fluids (water, gatorade/powerade/pedialyte, juices, or teas) to keep your throat moisturized and help further relieve irritation/discomfort.

## 2024-06-23 NOTE — ED Triage Notes (Addendum)
 Patient reports productive cough with green sputum, chills, body aches, runny nose and sore throat x 4 days. Patient reports Took mucinex, Tylenol  and Advil  last night. Rates body aches 4/10.
